# Patient Record
Sex: Female | Born: 1980 | Race: White | Hispanic: No | Marital: Married | Smoking: Never smoker
Health system: Southern US, Community
[De-identification: ages and names within clinical notes are randomized; demographics above are authoritative.]

## PROBLEM LIST (undated history)

## (undated) DIAGNOSIS — M419 Scoliosis, unspecified: Secondary | ICD-10-CM

## (undated) DIAGNOSIS — K567 Ileus, unspecified: Secondary | ICD-10-CM

## (undated) DIAGNOSIS — K529 Noninfective gastroenteritis and colitis, unspecified: Secondary | ICD-10-CM

## (undated) DIAGNOSIS — K9189 Other postprocedural complications and disorders of digestive system: Secondary | ICD-10-CM

## (undated) DIAGNOSIS — M1990 Unspecified osteoarthritis, unspecified site: Secondary | ICD-10-CM

## (undated) HISTORY — PX: OTHER SURGICAL HISTORY: SHX169

## (undated) HISTORY — PX: WISDOM TOOTH EXTRACTION: SHX21

---

## 2011-11-11 ENCOUNTER — Encounter (HOSPITAL_COMMUNITY): Payer: Self-pay | Admitting: Pharmacist

## 2011-11-14 ENCOUNTER — Encounter (HOSPITAL_COMMUNITY): Payer: Self-pay | Admitting: *Deleted

## 2011-11-14 ENCOUNTER — Inpatient Hospital Stay (HOSPITAL_COMMUNITY)
Admission: AD | Admit: 2011-11-14 | Discharge: 2011-11-21 | DRG: 370 | Disposition: A | Discharge status: Home / Self Care | Payer: BC Managed Care – PPO | Source: Ambulatory Visit | Attending: Obstetrics & Gynecology | Admitting: Obstetrics & Gynecology

## 2011-11-14 DIAGNOSIS — O99892 Other specified diseases and conditions complicating childbirth: Principal | ICD-10-CM | POA: Diagnosis present

## 2011-11-14 DIAGNOSIS — K560 Paralytic ileus: Secondary | ICD-10-CM | POA: Diagnosis not present

## 2011-11-14 DIAGNOSIS — K567 Ileus, unspecified: Secondary | ICD-10-CM | POA: Diagnosis not present

## 2011-11-14 DIAGNOSIS — Z22322 Carrier or suspected carrier of Methicillin resistant Staphylococcus aureus: Secondary | ICD-10-CM

## 2011-11-14 DIAGNOSIS — K5090 Crohn's disease, unspecified, without complications: Secondary | ICD-10-CM | POA: Diagnosis present

## 2011-11-14 HISTORY — DX: Noninfective gastroenteritis and colitis, unspecified: K52.9

## 2011-11-14 HISTORY — DX: Other postprocedural complications and disorders of digestive system: K91.89

## 2011-11-14 HISTORY — DX: Ileus, unspecified: K56.7

## 2011-11-15 ENCOUNTER — Encounter (HOSPITAL_COMMUNITY): Payer: Self-pay | Admitting: *Deleted

## 2011-11-15 ENCOUNTER — Encounter (HOSPITAL_COMMUNITY): Payer: Self-pay | Admitting: Anesthesiology

## 2011-11-15 ENCOUNTER — Encounter (HOSPITAL_COMMUNITY)
Admission: AD | Disposition: A | Discharge status: Home / Self Care | Payer: Self-pay | Source: Ambulatory Visit | Attending: Obstetrics & Gynecology

## 2011-11-15 ENCOUNTER — Inpatient Hospital Stay (HOSPITAL_COMMUNITY): Payer: BC Managed Care – PPO | Admitting: Anesthesiology

## 2011-11-15 LAB — CBC
Hemoglobin: 14.4 g/dL (ref 12.0–15.0)
MCH: 30.7 pg (ref 26.0–34.0)
MCHC: 34.4 g/dL (ref 30.0–36.0)
MCV: 89.1 fL (ref 78.0–100.0)
Platelets: 188 10*3/uL (ref 150–400)
RDW: 12.9 % (ref 11.5–15.5)
WBC: 19.9 10*3/uL — ABNORMAL HIGH (ref 4.0–10.5)

## 2011-11-15 LAB — ABO/RH: ABO/RH(D): O POS

## 2011-11-15 SURGERY — Surgical Case
Anesthesia: Spinal | Site: Abdomen | Wound class: Clean Contaminated

## 2011-11-15 MED ORDER — OXYCODONE-ACETAMINOPHEN 5-325 MG PO TABS
1.0000 | ORAL_TABLET | ORAL | Status: DC | PRN
  Administered 2011-11-15 – 2011-11-16 (×5): 1 via ORAL
  Filled 2011-11-15 (×2): qty 2
  Filled 2011-11-15 (×2): qty 1
  Filled 2011-11-15: qty 2

## 2011-11-15 MED ORDER — CEFAZOLIN SODIUM 1-5 GM-% IV SOLN: 1.0000 g | INTRAVENOUS | Status: DC

## 2011-11-15 MED ORDER — IBUPROFEN 600 MG PO TABS
600.0000 mg | ORAL_TABLET | Freq: Four times a day (QID) | ORAL | Status: DC
  Administered 2011-11-15 – 2011-11-17 (×7): 600 mg via ORAL
  Filled 2011-11-15 (×8): qty 1

## 2011-11-15 MED ORDER — METHYLERGONOVINE MALEATE 0.2 MG/ML IJ SOLN: 0.2000 mg | INTRAMUSCULAR | Status: DC | PRN

## 2011-11-15 MED ORDER — NALOXONE HCL 0.4 MG/ML IJ SOLN: 0.4000 mg | INTRAMUSCULAR | Status: DC | PRN

## 2011-11-15 MED ORDER — CEFAZOLIN SODIUM 1-5 GM-% IV SOLN
INTRAVENOUS | Status: AC
  Filled 2011-11-15: qty 50

## 2011-11-15 MED ORDER — MORPHINE SULFATE (PF) 0.5 MG/ML IJ SOLN
INTRAMUSCULAR | Status: DC | PRN
  Administered 2011-11-15: .1 mg via INTRATHECAL

## 2011-11-15 MED ORDER — LACTATED RINGERS IV SOLN: INTRAVENOUS | Status: DC

## 2011-11-15 MED ORDER — SODIUM CHLORIDE 0.9 % IV SOLN
1.0000 ug/kg/h | INTRAVENOUS | Status: DC | PRN
  Filled 2011-11-15: qty 2.5

## 2011-11-15 MED ORDER — FENTANYL CITRATE 0.05 MG/ML IJ SOLN
INTRAMUSCULAR | Status: DC | PRN
  Administered 2011-11-15: 35 ug via INTRAVENOUS
  Administered 2011-11-15: 50 ug via INTRAVENOUS

## 2011-11-15 MED ORDER — DIPHENHYDRAMINE HCL 25 MG PO CAPS: 25.0000 mg | ORAL_CAPSULE | Freq: Four times a day (QID) | ORAL | Status: DC | PRN

## 2011-11-15 MED ORDER — WITCH HAZEL-GLYCERIN EX PADS: 1.0000 "application " | MEDICATED_PAD | CUTANEOUS | Status: DC | PRN

## 2011-11-15 MED ORDER — LACTATED RINGERS IV SOLN
INTRAVENOUS | Status: DC | PRN
  Administered 2011-11-15 (×3): via INTRAVENOUS

## 2011-11-15 MED ORDER — METHYLERGONOVINE MALEATE 0.2 MG PO TABS: 0.2000 mg | ORAL_TABLET | ORAL | Status: DC | PRN

## 2011-11-15 MED ORDER — MORPHINE SULFATE 0.5 MG/ML IJ SOLN
INTRAMUSCULAR | Status: AC
  Filled 2011-11-15: qty 10

## 2011-11-15 MED ORDER — SIMETHICONE 80 MG PO CHEW: 80.0000 mg | CHEWABLE_TABLET | ORAL | Status: DC | PRN

## 2011-11-15 MED ORDER — OXYTOCIN 20 UNITS IN LACTATED RINGERS INFUSION - SIMPLE
125.0000 mL/h | INTRAVENOUS | Status: AC
  Administered 2011-11-15: 125 mL/h via INTRAVENOUS
  Filled 2011-11-15: qty 1000

## 2011-11-15 MED ORDER — NALBUPHINE SYRINGE 5 MG/0.5 ML
5.0000 mg | INJECTION | INTRAMUSCULAR | Status: DC | PRN
  Filled 2011-11-15: qty 1

## 2011-11-15 MED ORDER — SENNOSIDES-DOCUSATE SODIUM 8.6-50 MG PO TABS
2.0000 | ORAL_TABLET | Freq: Every day | ORAL | Status: DC
  Administered 2011-11-16: 2 via ORAL

## 2011-11-15 MED ORDER — ONDANSETRON HCL 4 MG/2ML IJ SOLN: 4.0000 mg | Freq: Three times a day (TID) | INTRAMUSCULAR | Status: DC | PRN

## 2011-11-15 MED ORDER — ONDANSETRON HCL 4 MG/2ML IJ SOLN
INTRAMUSCULAR | Status: DC | PRN
  Administered 2011-11-15: 4 mg via INTRAVENOUS

## 2011-11-15 MED ORDER — LANOLIN HYDROUS EX OINT: 1.0000 "application " | TOPICAL_OINTMENT | CUTANEOUS | Status: DC | PRN

## 2011-11-15 MED ORDER — MENTHOL 3 MG MT LOZG: 1.0000 | LOZENGE | OROMUCOSAL | Status: DC | PRN

## 2011-11-15 MED ORDER — ZOLPIDEM TARTRATE 5 MG PO TABS: 5.0000 mg | ORAL_TABLET | Freq: Every evening | ORAL | Status: DC | PRN

## 2011-11-15 MED ORDER — FAMOTIDINE IN NACL 20-0.9 MG/50ML-% IV SOLN
INTRAVENOUS | Status: AC
  Filled 2011-11-15: qty 50

## 2011-11-15 MED ORDER — SCOPOLAMINE 1 MG/3DAYS TD PT72
1.0000 | MEDICATED_PATCH | Freq: Once | TRANSDERMAL | Status: AC
  Administered 2011-11-15: 1.5 mg via TRANSDERMAL

## 2011-11-15 MED ORDER — ONDANSETRON HCL 4 MG/2ML IJ SOLN: 4.0000 mg | INTRAMUSCULAR | Status: DC | PRN

## 2011-11-15 MED ORDER — DIPHENHYDRAMINE HCL 50 MG/ML IJ SOLN: 25.0000 mg | INTRAMUSCULAR | Status: DC | PRN

## 2011-11-15 MED ORDER — OXYTOCIN 10 UNIT/ML IJ SOLN
INTRAMUSCULAR | Status: AC
  Filled 2011-11-15: qty 2

## 2011-11-15 MED ORDER — NALBUPHINE SYRINGE 5 MG/0.5 ML
5.0000 mg | INJECTION | INTRAMUSCULAR | Status: DC | PRN
Start: 2011-11-15 — End: 2011-11-17
  Filled 2011-11-15: qty 1

## 2011-11-15 MED ORDER — SODIUM CHLORIDE 0.9 % IJ SOLN: 3.0000 mL | INTRAMUSCULAR | Status: DC | PRN

## 2011-11-15 MED ORDER — SIMETHICONE 80 MG PO CHEW
80.0000 mg | CHEWABLE_TABLET | Freq: Three times a day (TID) | ORAL | Status: DC
  Administered 2011-11-15 – 2011-11-16 (×6): 80 mg via ORAL

## 2011-11-15 MED ORDER — ONDANSETRON HCL 4 MG/2ML IJ SOLN
INTRAMUSCULAR | Status: AC
  Filled 2011-11-15: qty 2

## 2011-11-15 MED ORDER — HYDROMORPHONE HCL PF 1 MG/ML IJ SOLN: 0.2500 mg | INTRAMUSCULAR | Status: DC | PRN

## 2011-11-15 MED ORDER — BUPIVACAINE IN DEXTROSE 0.75-8.25 % IT SOLN
INTRATHECAL | Status: DC | PRN
  Administered 2011-11-15: 1.5 mL via INTRATHECAL

## 2011-11-15 MED ORDER — PHENYLEPHRINE HCL 10 MG/ML IJ SOLN
INTRAMUSCULAR | Status: DC | PRN
  Administered 2011-11-15: 40 ug via INTRAVENOUS
  Administered 2011-11-15: 80 ug via INTRAVENOUS
  Administered 2011-11-15: 40 ug via INTRAVENOUS

## 2011-11-15 MED ORDER — IBUPROFEN 600 MG PO TABS: 600.0000 mg | ORAL_TABLET | Freq: Four times a day (QID) | ORAL | Status: DC | PRN

## 2011-11-15 MED ORDER — PHENYLEPHRINE 40 MCG/ML (10ML) SYRINGE FOR IV PUSH (FOR BLOOD PRESSURE SUPPORT)
PREFILLED_SYRINGE | INTRAVENOUS | Status: AC
  Filled 2011-11-15: qty 10

## 2011-11-15 MED ORDER — DIPHENHYDRAMINE HCL 25 MG PO CAPS: 25.0000 mg | ORAL_CAPSULE | ORAL | Status: DC | PRN

## 2011-11-15 MED ORDER — BUPIVACAINE HCL (PF) 0.25 % IJ SOLN
INTRAMUSCULAR | Status: AC
  Filled 2011-11-15: qty 30

## 2011-11-15 MED ORDER — CITRIC ACID-SODIUM CITRATE 334-500 MG/5ML PO SOLN
ORAL | Status: AC
  Administered 2011-11-15: 30 mL
  Filled 2011-11-15: qty 15

## 2011-11-15 MED ORDER — CEFAZOLIN SODIUM 1-5 GM-% IV SOLN
INTRAVENOUS | Status: DC | PRN
  Administered 2011-11-15: 1 g via INTRAVENOUS

## 2011-11-15 MED ORDER — TETANUS-DIPHTH-ACELL PERTUSSIS 5-2.5-18.5 LF-MCG/0.5 IM SUSP: 0.5000 mL | Freq: Once | INTRAMUSCULAR | Status: DC

## 2011-11-15 MED ORDER — PRENATAL MULTIVITAMIN CH
1.0000 | ORAL_TABLET | Freq: Every day | ORAL | Status: DC
  Administered 2011-11-16: 1 via ORAL
  Filled 2011-11-15: qty 1

## 2011-11-15 MED ORDER — ONDANSETRON HCL 4 MG PO TABS
4.0000 mg | ORAL_TABLET | ORAL | Status: DC | PRN
  Administered 2011-11-16: 4 mg via ORAL
  Filled 2011-11-15: qty 1

## 2011-11-15 MED ORDER — FENTANYL CITRATE 0.05 MG/ML IJ SOLN
INTRAMUSCULAR | Status: AC
  Filled 2011-11-15: qty 2

## 2011-11-15 MED ORDER — DIBUCAINE 1 % RE OINT: 1.0000 "application " | TOPICAL_OINTMENT | RECTAL | Status: DC | PRN

## 2011-11-15 MED ORDER — BUPIVACAINE HCL (PF) 0.25 % IJ SOLN
INTRAMUSCULAR | Status: DC | PRN
  Administered 2011-11-15: 10 mL

## 2011-11-15 MED ORDER — SCOPOLAMINE 1 MG/3DAYS TD PT72
MEDICATED_PATCH | TRANSDERMAL | Status: AC
  Administered 2011-11-15: 1.5 mg via TRANSDERMAL
  Filled 2011-11-15: qty 1

## 2011-11-15 MED ORDER — FENTANYL CITRATE 0.05 MG/ML IJ SOLN
INTRAMUSCULAR | Status: DC | PRN
  Administered 2011-11-15: 15 ug via INTRATHECAL

## 2011-11-15 MED ORDER — LACTATED RINGERS IV SOLN
INTRAVENOUS | Status: DC
  Administered 2011-11-15: 13:00:00 via INTRAVENOUS

## 2011-11-15 MED ORDER — METOCLOPRAMIDE HCL 5 MG/ML IJ SOLN: 10.0000 mg | Freq: Three times a day (TID) | INTRAMUSCULAR | Status: DC | PRN

## 2011-11-15 MED ORDER — DIPHENHYDRAMINE HCL 50 MG/ML IJ SOLN: 12.5000 mg | INTRAMUSCULAR | Status: DC | PRN

## 2011-11-15 MED ORDER — MEPERIDINE HCL 25 MG/ML IJ SOLN: 6.2500 mg | INTRAMUSCULAR | Status: DC | PRN

## 2011-11-15 MED ORDER — MORPHINE SULFATE (PF) 0.5 MG/ML IJ SOLN
INTRAMUSCULAR | Status: DC | PRN
  Administered 2011-11-15: 1 mg via INTRAVENOUS
  Administered 2011-11-15: 1.9 mg via INTRAVENOUS
  Administered 2011-11-15 (×2): 1 mg via INTRAVENOUS

## 2011-11-15 MED ORDER — HYDROMORPHONE HCL PF 1 MG/ML IJ SOLN
0.5000 mg | INTRAMUSCULAR | Status: AC
  Administered 2011-11-15: 0.5 mg via INTRAVENOUS
  Filled 2011-11-15: qty 1

## 2011-11-15 SURGICAL SUPPLY — 28 items
CLOTH BEACON ORANGE TIMEOUT ST (SAFETY) ×2 IMPLANT
CONTAINER PREFILL 10% NBF 15ML (MISCELLANEOUS) IMPLANT
DRESSING TELFA 8X3 (GAUZE/BANDAGES/DRESSINGS) IMPLANT
ELECT REM PT RETURN 9FT ADLT (ELECTROSURGICAL) ×2
ELECTRODE REM PT RTRN 9FT ADLT (ELECTROSURGICAL) ×1 IMPLANT
EXTRACTOR VACUUM M CUP 4 TUBE (SUCTIONS) IMPLANT
GAUZE SPONGE 4X4 12PLY STRL LF (GAUZE/BANDAGES/DRESSINGS) ×2 IMPLANT
GLOVE BIO SURGEON STRL SZ7.5 (GLOVE) ×4 IMPLANT
GOWN PREVENTION PLUS LG XLONG (DISPOSABLE) ×4 IMPLANT
GOWN PREVENTION PLUS XLARGE (GOWN DISPOSABLE) ×2 IMPLANT
KIT ABG SYR 3ML LUER SLIP (SYRINGE) IMPLANT
NEEDLE HYPO 25X1 1.5 SAFETY (NEEDLE) ×2 IMPLANT
NEEDLE HYPO 25X5/8 SAFETYGLIDE (NEEDLE) IMPLANT
NS IRRIG 1000ML POUR BTL (IV SOLUTION) ×2 IMPLANT
PACK C SECTION WH (CUSTOM PROCEDURE TRAY) ×2 IMPLANT
PAD ABD 7.5X8 STRL (GAUZE/BANDAGES/DRESSINGS) IMPLANT
SLEEVE SCD COMPRESS KNEE MED (MISCELLANEOUS) ×2 IMPLANT
STAPLER VISISTAT 35W (STAPLE) ×2 IMPLANT
SUT MNCRL 0 VIOLET CTX 36 (SUTURE) ×2 IMPLANT
SUT MON AB 2-0 CT1 27 (SUTURE) ×2 IMPLANT
SUT MON AB-0 CT1 36 (SUTURE) ×4 IMPLANT
SUT MONOCRYL 0 CTX 36 (SUTURE) ×2
SUT PLAIN 0 NONE (SUTURE) IMPLANT
SUT PLAIN 2 0 XLH (SUTURE) IMPLANT
SYR CONTROL 10ML LL (SYRINGE) ×2 IMPLANT
TOWEL OR 17X24 6PK STRL BLUE (TOWEL DISPOSABLE) ×4 IMPLANT
TRAY FOLEY CATH 14FR (SET/KITS/TRAYS/PACK) ×2 IMPLANT
WATER STERILE IRR 1000ML POUR (IV SOLUTION) ×2 IMPLANT

## 2011-11-15 NOTE — Progress Notes (Signed)
Patient ID: Connie Hunter, female   DOB: [birth year], 31 y.o.   MRN: [id number]  POSTOPERATIVE DAY # 0 S/P cesarean section   S:         Reports feeling ok - tired             Tolerating po intake / no  nausea / no  vomiting / no flatus / no  BM             Bleeding is light             Pain controlled with long-acting narcotic             Up ad lib / ambulatory  Newborn breast-feeding     O:  A & O x 3 NAD             VS:             Blood pressure 112/63, pulse 67, temperature 98.2 F (36.8 C), temperature source Oral, resp. rate 18, height 5\' 3"  (1.6 m), weight 60.782 kg (134 lb), SpO2 99.00%.  LABS: WBC/Hgb/Hct/Plts:  19.9/12.0/35.5/188 (04/22 0615)   Lungs: Clear and unlabored  Heart: regular rate and rhythm / no mumurs  Abdomen: soft, non-tender, non-distended             Fundus: firm, non-tender, Ueven             Dressing intact with some dried drainage on dressing (marked without expansion)              Perineum: intact  Lochia: light  Extremities: no edema, no calf pain or tenderness,SCD in place  A:        POD # 0 S/P cesarean section             P:        Routine postoperative care              Advance activity as tolerated     [name] 11/15/2011, 8:58 AM

## 2011-11-15 NOTE — MAU Provider Note (Signed)
  History   Labor  CSN: [id number]  Arrival date and time: 11/14/11 2314   None     No chief complaint on file.  HPI  OB History    Grav Para Term Preterm Abortions TAB SAB Ect Mult Living   1         0      Past Medical History  Diagnosis Date  . Colitis     Past Surgical History  Procedure Date  . Scoliosis   . Colon removed   . Wisdom tooth extraction     History reviewed. No pertinent family history.  History  Substance Use Topics  . Smoking status: Not on file  . Smokeless tobacco: Not on file  . Alcohol Use: Not on file    Allergies:  Allergies  Allergen Reactions  . Penicillins Rash    Prescriptions prior to admission  Medication Sig Dispense Refill  . acetaminophen (TYLENOL) 325 MG tablet Take 650 mg by mouth every 6 (six) hours as needed. For pain or headache      . adalimumab (HUMIRA) 40 MG/0.8ML injection Inject 40 mg into the skin every 14 (fourteen) days.      . cholecalciferol (VITAMIN D) 1000 UNITS tablet Take 2,000 Units by mouth daily.      . Prenatal Vit-Fe Fumarate-FA (PRENATAL MULTIVITAMIN) TABS Take 1 tablet by mouth daily.        ROS Physical Exam dictated   Blood pressure 122/76, pulse 69, temperature 97.9 F (36.6 C), temperature source Oral, resp. rate 18, height 5\' 3"  (1.6 m), weight 60.782 kg (134 lb), SpO2 100.00%.  Physical Exam  MAU Course  Procedures  MDM na  Assessment and Plan  37 weeks Active labor Active Anorectal disease Primary csection Note dictated  Connie Hunter 11/15/2011, 12:12 AM

## 2011-11-15 NOTE — Anesthesia Postprocedure Evaluation (Signed)
  Anesthesia Post-op Note  Patient: Connie Hunter  Procedure(s) Performed: Procedure(s) (LRB): CESAREAN SECTION (N/A)  Patient Location: Mother/Baby  Anesthesia Type: Spinal  Level of Consciousness: awake  Airway and Oxygen Therapy: Patient Spontanous Breathing  Post-op Pain: mild  Post-op Assessment: Patient's Cardiovascular Status Stable and Respiratory Function Stable  Post-op Vital Signs: stable  Complications: No apparent anesthesia complications

## 2011-11-15 NOTE — Addendum Note (Signed)
Addendum  created 11/15/11 0757 by Renford Dills, CRNA   Modules edited:Notes Section

## 2011-11-15 NOTE — Transfer of Care (Signed)
Immediate Anesthesia Transfer of Care Note  Patient: Connie Hunter  Procedure(s) Performed: Procedure(s) (LRB): CESAREAN SECTION (N/A)  Patient Location: PACU  Anesthesia Type: Spinal and Epidural  Level of Consciousness: awake, alert  and oriented  Airway & Oxygen Therapy: Patient Spontanous Breathing  Post-op Assessment: Report given to PACU RN and Post -op Vital signs reviewed and stable  Post vital signs: stable  Complications: No apparent anesthesia complications

## 2011-11-15 NOTE — H&P (Signed)
NAMEKEYNA, BLIZARD NO.:  [id number]  MEDICAL RECORD NO.:  [id number]  LOCATION:  AV40                          FACILITY:  [facility]  PHYSICIAN:  Lenoard Aden, M.D.DATE OF BIRTH:  Oct 24, 1980  DATE OF ADMISSION:  11/14/2011 DATE OF DISCHARGE:                             HISTORY & PHYSICAL   CHIEF COMPLAINT:  Labor.  HISTORY OF PRESENT ILLNESS:  She is a 31 year old white female, G1, P0, at [redacted] weeks gestation who presents in active labor.  PAST MEDICAL HISTORY:  She has a medical history of Crohn disease and colitis with prominent anorectal disease.  ALLERGIES:  PENICILLIN.  MEDICATIONS:  Prenatal vitamins.  Previously on Humira.  SOCIAL HISTORY:  Otherwise, noncontributory.  FAMILY HISTORY:  Seizure disorder, throat cancer, Down syndrome, and emphysema.  SURGICAL HISTORY:  Remarkable for scoliosis with rod placement and colon resection with J pouch in 2007.  PHYSICAL EXAMINATION:  GENERAL:  She is a well-developed, well- nourished, white female, in no acute distress. HEENT:  Normal. NECK:  Supple.  Full range of motion. LUNGS:  Clear. HEART:  Regular rate and rhythm. ABDOMEN:  Soft, gravid, nontender.  Cervix per RN is 5, 80% vertex, 0 station. EXTREMITIES:  There are no cords. NEUROLOGIC:  Nonfocal. SKIN:  Intact.  NST is reactive.  IMPRESSION: 1. Intrauterine pregnancy at 37 weeks in active labor. 2. History of ulcerative colitis, status post colon and rectum with a     history of prominent anorectal disease and a recommendation for     primary cesarean section.  PLAN:  Proceed with primary low segment transverse cesarean section. Risks of anesthesia, infection, bleeding, injury to abdominal organs, need for repair was discussed.  Delayed versus immediate complications include bowel and bladder injury noted.  The patient acknowledges.  We will proceed.     Lenoard Aden, M.D.     RJT/MEDQ  D:  11/14/2011  T:  11/15/2011  Job:   [id number]

## 2011-11-15 NOTE — Op Note (Signed)
Cesarean Section Procedure Note  Indications: History of Crohns colitis s/p resection with severe Anorectal disease Active labor  Pre-operative Diagnosis: 37 week 0 day pregnancy.  Post-operative Diagnosis: same  Surgeon: Lenoard Aden   Assistants: Juliene Pina, MD  Anesthesia: Local anesthesia 0.25.% bupivacaine and Spinal anesthesia  ASA Class: 2  Procedure Details  The patient was seen in the Holding Room. The risks, benefits, complications, treatment options, and expected outcomes were discussed with the patient.  The patient concurred with the proposed plan, giving informed consent. The risks of anesthesia, infection, bleeding and possible injury to other organs discussed. Injury to bowel, bladder, or ureter with possible need for repair discussed. Possible need for transfusion with secondary risks of hepatitis or HIV acquisition discussed. Post operative complications to include but not limited to DVT, PE and Pneumonia noted. The site of surgery properly noted/marked. The patient was taken to Operating Room # 1, identified as Connie Hunter and the procedure verified as C-Section Delivery. A Time Out was held and the above information confirmed.  After induction of anesthesia, the patient was draped and prepped in the usual sterile manner. A Pfannenstiel incision was made and carried down through the subcutaneous tissue to the fascia. Fascial incision was made and extended transversely using Mayo scissors. The fascia was separated from the underlying rectus tissue superiorly and inferiorly. The peritoneum was identified and entered. Peritoneal incision was extended longitudinally. Significant omental adhesions to anterior abdominal wall and bladder flap are lysed sharply. Piece of omentum is excised.  The utero-vesical peritoneal reflection was incised transversely and the bladder flap was bluntly freed from the lower uterine segment. A low transverse uterine incision(Kerr hysterotomy) was  made. Delivered from vertex presentation was a  female with Apgar scores of 8 at one minute and 9 at five minutes. Bulb suctioning gently performed. Neonatal team in attendance.After the umbilical cord was clamped and cut cord blood was obtained for evaluation. The placenta was removed intact and appeared normal. The uterus was curetted with a dry lap pack. Good hemostasis was noted.The uterine outline, tubes and ovaries appeared normal. The uterine incision was closed with running locked sutures of 0 Monocryl x 2 layers. Hemostasis was observed. Lavage was carried out until clear.The fascia was then reapproximated with running sutures of 0 Monocryl. The skin was reapproximated with staples.  Instrument, sponge, and needle counts were correct prior the abdominal closure and at the conclusion of the case.   Findings: Omental adhesions  Estimated Blood Loss:  500         Drains: foley                 Specimens: placenta to path with Succenturiate lobe                 Complications:  None; patient tolerated the procedure well.         Disposition: stable         Condition: stable  Attending Attestation: I performed the procedure.

## 2011-11-15 NOTE — OR Nursing (Signed)
Fundal Massage by DLWegner RN 

## 2011-11-15 NOTE — Anesthesia Procedure Notes (Signed)
Combined Spinal-Epidural  Patient location during procedure: OR Start time: 11/15/2011 12:40 AM Staffing Anesthesiologist: CASSIDY, AMY Performed by: anesthesiologist  Preanesthetic Checklist Completed: patient identified, site marked, surgical consent, pre-op evaluation, timeout performed, IV checked, risks and benefits discussed and monitors and equipment checked Spinal Block Patient position: sitting Prep: site prepped and draped and DuraPrep Patient monitoring: cardiac monitor, continuous pulse ox, blood pressure and heart rate Approach: midline Location: L4-5 Injection technique: catheter Needle Needle type: Tuohy and Sprotte  Needle gauge: 24 G Needle length: 12.7 cm Needle insertion depth: 5 cm Catheter type: closed end flexible Catheter size: 19 g Catheter at skin depth: 10 cm Additional Notes Difficulty at L3-4 (has had rod placed for scoliosis).  Second attempt was at L4-5 and was immediately successful.  Of note, epidural space felt scarred when spinal needle was passed through tuohy, but clear free flow CSF was immediately present from spinal needle.  Epidural catheter was threaded with some difficulty initially, but easily after initial exit from [place].  SAB rising appropriately, patient no longer feeling contractions.  Jasmine December, MD

## 2011-11-15 NOTE — Anesthesia Postprocedure Evaluation (Signed)
Anesthesia Post Note  Patient: Connie Hunter  Procedure(s) Performed: Procedure(s) (LRB): CESAREAN SECTION (N/A)  Anesthesia type: Spinal  Patient location: PACU  Post pain: Pain level controlled  Post assessment: Post-op Vital signs reviewed  Last Vitals:  Filed Vitals:   11/15/11 0356  BP: 114/66  Pulse: 69  Temp: 36.7 C  Resp: 18    Post vital signs: Reviewed  Level of consciousness: awake  Complications: No apparent anesthesia complications

## 2011-11-15 NOTE — Anesthesia Preprocedure Evaluation (Signed)
Anesthesia Evaluation  Patient identified by MRN, date of birth, ID band Patient awake    Reviewed: Allergy & Precautions, H&P , NPO status , Patient's Chart, lab work & pertinent test results, reviewed documented beta blocker date and time   History of Anesthesia Complications Negative for: history of anesthetic complications  Airway Mallampati: III TM Distance: >3 FB Neck ROM: full    Dental  (+) Teeth Intact   Pulmonary neg pulmonary ROS,  breath sounds clear to auscultation        Cardiovascular negative cardio ROS  Rhythm:regular Rate:Normal     Neuro/Psych  Neuromuscular disease (scoliosis s/p thoracic rod placement) negative psych ROS   GI/Hepatic Neg liver ROS, Ulcerative colitis, s/p near total colectomy with reanastamosis   Endo/Other  negative endocrine ROS  Renal/GU negative Renal ROS     Musculoskeletal   Abdominal   Peds  Hematology negative hematology ROS (+)   Anesthesia Other Findings   Reproductive/Obstetrics (+) Pregnancy (36 weeks, SROM, labor)                           Anesthesia Physical Anesthesia Plan  ASA: III and Emergent  Anesthesia Plan: Combined Spinal and Epidural   Post-op Pain Management:    Induction:   Airway Management Planned:   Additional Equipment:   Intra-op Plan:   Post-operative Plan:   Informed Consent: I have reviewed the patients History and Physical, chart, labs and discussed the procedure including the risks, benefits and alternatives for the proposed anesthesia with the patient or authorized representative who has indicated his/her understanding and acceptance.   Dental Advisory Given  Plan Discussed with: Surgeon and CRNA  Anesthesia Plan Comments:         Anesthesia Quick Evaluation

## 2011-11-16 ENCOUNTER — Encounter (HOSPITAL_COMMUNITY): Payer: Self-pay | Admitting: Obstetrics and Gynecology

## 2011-11-16 MED ORDER — PROMETHAZINE HCL 25 MG PO TABS: 25.0000 mg | ORAL_TABLET | Freq: Four times a day (QID) | ORAL | Status: DC | PRN

## 2011-11-16 MED ORDER — PROMETHAZINE HCL 25 MG RE SUPP
25.0000 mg | Freq: Four times a day (QID) | RECTAL | Status: DC | PRN
  Administered 2011-11-17 (×2): 25 mg via RECTAL
  Filled 2011-11-16 (×2): qty 1

## 2011-11-16 MED ORDER — BISACODYL 10 MG RE SUPP
10.0000 mg | Freq: Once | RECTAL | Status: AC
  Administered 2011-11-16: 10 mg via RECTAL
  Filled 2011-11-16: qty 1

## 2011-11-16 NOTE — Progress Notes (Signed)
Patient ID: Connie Hunter, female   DOB: November 07, 1980, 31 y.o.   MRN: [id number]  POSTOPERATIVE DAY # 1 S/P cesarean section   S:         Reports feeling well, better than yesterday.             Tolerating po intake / no  nausea / no  vomiting / no flatus / no  BM             Bleeding is light             Pain controlled with Motrin and Percocet.             Up ad lib / ambulatory  Newborn breast-feeding    Circ planned for today  O:  A & O x 3 NAD             VS:             Blood pressure 92/62, pulse 80, temperature 98.2 F (36.8 C), temperature source Oral, resp. rate 20, height 5\' 3"  (1.6 m), weight 60.782 kg (134 lb), SpO2 97.00%.  LABS:    Lab Results  Component Value Date   WBC 19.9* 11/15/2011   HGB 12.0 11/15/2011   HCT 35.5* 11/15/2011   MCV 89.0 11/15/2011   PLT 188 11/15/2011     Lungs: Clear and unlabored  Heart: regular rate and rhythm / no mumurs  Abdomen: soft, non-tender, non-distended             Fundus: firm, non-tender, Ueven             Dressing intact with some dried drainage on dressing (marked without expansion)              Perineum: intact  Lochia: light  Extremities: no edema, no calf pain or tenderness,SCD in place  A:        POD # 1 S/P cesarean section  Hx colon resection with J-pouch / ulcerative colitis              P:        Routine postoperative care              Advance activity as tolerated  Encouraged ambulation and warm PO fluids to increase gut motility  Anticipate discharge home in AM.      Rashi Giuliani 11/16/2011, 7:59 AM

## 2011-11-17 ENCOUNTER — Inpatient Hospital Stay (HOSPITAL_COMMUNITY): Payer: BC Managed Care – PPO

## 2011-11-17 ENCOUNTER — Encounter (HOSPITAL_COMMUNITY): Payer: Self-pay | Admitting: Radiology

## 2011-11-17 DIAGNOSIS — R141 Gas pain: Secondary | ICD-10-CM

## 2011-11-17 DIAGNOSIS — R143 Flatulence: Secondary | ICD-10-CM

## 2011-11-17 DIAGNOSIS — R142 Eructation: Secondary | ICD-10-CM

## 2011-11-17 DIAGNOSIS — R112 Nausea with vomiting, unspecified: Secondary | ICD-10-CM

## 2011-11-17 LAB — SAMPLE TO BLOOD BANK

## 2011-11-17 LAB — CBC
HCT: 39.9 % (ref 36.0–46.0)
Hemoglobin: 13.5 g/dL (ref 12.0–15.0)
MCH: 30.8 pg (ref 26.0–34.0)
MCHC: 33.8 g/dL (ref 30.0–36.0)
MCV: 91.1 fL (ref 78.0–100.0)
Platelets: 293 10*3/uL (ref 150–400)
RBC: 4.38 MIL/uL (ref 3.87–5.11)
RDW: 13.3 % (ref 11.5–15.5)
WBC: 7.8 10*3/uL (ref 4.0–10.5)

## 2011-11-17 LAB — COMPREHENSIVE METABOLIC PANEL
ALT: 9 U/L (ref 0–35)
AST: 14 U/L (ref 0–37)
Albumin: 1.8 g/dL — ABNORMAL LOW (ref 3.5–5.2)
Alkaline Phosphatase: 99 U/L (ref 39–117)
BUN: 9 mg/dL (ref 6–23)
CO2: 26 mEq/L (ref 19–32)
Calcium: 8 mg/dL — ABNORMAL LOW (ref 8.4–10.5)
Chloride: 100 mEq/L (ref 96–112)
Creatinine, Ser: 0.71 mg/dL (ref 0.50–1.10)
GFR calc Af Amer: >90 mL/min (ref >90)
GFR calc non Af Amer: >90 mL/min (ref >90)
Glucose, Bld: 201 mg/dL — ABNORMAL HIGH (ref 70–99)
Potassium: 3.8 mEq/L (ref 3.5–5.1)
Sodium: 138 mEq/L (ref 135–145)
Total Bilirubin: 0.4 mg/dL (ref 0.3–1.2)
Total Protein: 6 g/dL (ref 6.0–8.3)

## 2011-11-17 LAB — MRSA PCR SCREENING: MRSA by PCR: POSITIVE — AB

## 2011-11-17 MED ORDER — NALOXONE HCL 0.4 MG/ML IJ SOLN: 0.4000 mg | INTRAMUSCULAR | Status: DC | PRN

## 2011-11-17 MED ORDER — MUPIROCIN 2 % EX OINT
1.0000 "application " | TOPICAL_OINTMENT | Freq: Two times a day (BID) | CUTANEOUS | Status: DC
  Administered 2011-11-17 – 2011-11-20 (×8): 1 via NASAL
  Filled 2011-11-17: qty 22

## 2011-11-17 MED ORDER — CHLORHEXIDINE GLUCONATE CLOTH 2 % EX PADS
6.0000 | MEDICATED_PAD | Freq: Every day | CUTANEOUS | Status: DC
  Administered 2011-11-18 – 2011-11-21 (×4): 6 via TOPICAL

## 2011-11-17 MED ORDER — ONDANSETRON HCL 4 MG/2ML IJ SOLN: 4.0000 mg | Freq: Four times a day (QID) | INTRAMUSCULAR | Status: DC | PRN

## 2011-11-17 MED ORDER — HYDROMORPHONE 0.3 MG/ML IV SOLN
INTRAVENOUS | Status: DC
  Administered 2011-11-17: 14:00:00 via INTRAVENOUS
  Administered 2011-11-17: 0.799 mL via INTRAVENOUS
  Administered 2011-11-17: 0.99 mg via INTRAVENOUS
  Administered 2011-11-18: 0.2 mg via INTRAVENOUS
  Administered 2011-11-18: 0.4 mg via INTRAVENOUS
  Administered 2011-11-18: 1.39 mg via INTRAVENOUS
  Administered 2011-11-18: 0.999 mg via INTRAVENOUS
  Administered 2011-11-18: 0.39 mg via INTRAVENOUS
  Administered 2011-11-19: 0.399 mg via INTRAVENOUS
  Administered 2011-11-19: 0.8 mg via INTRAVENOUS
  Filled 2011-11-17: qty 25

## 2011-11-17 MED ORDER — PROMETHAZINE HCL 25 MG/ML IJ SOLN
12.5000 mg | INTRAMUSCULAR | Status: DC | PRN
  Administered 2011-11-17: 12.5 mg via INTRAVENOUS
  Filled 2011-11-17: qty 1

## 2011-11-17 MED ORDER — BUTORPHANOL TARTRATE 2 MG/ML IJ SOLN
1.0000 mg | INTRAMUSCULAR | Status: DC | PRN
  Administered 2011-11-17: 1 mg via INTRAVENOUS
  Filled 2011-11-17: qty 1

## 2011-11-17 MED ORDER — SODIUM CHLORIDE 0.9 % IJ SOLN: 9.0000 mL | INTRAMUSCULAR | Status: DC | PRN

## 2011-11-17 MED ORDER — DEXTROSE IN LACTATED RINGERS 5 % IV SOLN
INTRAVENOUS | Status: DC
  Administered 2011-11-17: 11:00:00 via INTRAVENOUS

## 2011-11-17 MED ORDER — DIPHENHYDRAMINE HCL 50 MG/ML IJ SOLN: 12.5000 mg | Freq: Four times a day (QID) | INTRAMUSCULAR | Status: DC | PRN

## 2011-11-17 MED ORDER — IOHEXOL 300 MG/ML  SOLN
100.0000 mL | Freq: Once | INTRAMUSCULAR | Status: AC | PRN
  Administered 2011-11-17: 100 mL via INTRAVENOUS

## 2011-11-17 MED ORDER — DIPHENHYDRAMINE HCL 12.5 MG/5ML PO ELIX
12.5000 mg | ORAL_SOLUTION | Freq: Four times a day (QID) | ORAL | Status: DC | PRN
  Filled 2011-11-17: qty 5

## 2011-11-17 NOTE — Progress Notes (Addendum)
POSTOPERATIVE DAY # 3 S/P cesarean section   S:        Reports feeling poorly - abdominal pain and vomiting for past several hours             Unable to tolerate any po intake / + nausea / + vomiting green bile              No flatus / small BM - scant amount of liquid after ducolax suppository last pm             Bleeding is light             Up ad lib / ambulatory   Newborn breast-feeding  / Circumcision done   O:  A & O x 3 / general malaise / lethargic             VS: Blood pressure 125/79, pulse 67, temperature 99 F (37.2 C), temperature source Oral, resp. rate 20, height 5\' 3"  (1.6 m), weight 60.782 kg (134 lb), SpO2 97.00%.  LABS:   (pending)  Lungs: Clear and unlabored  Heart: regular rate and rhythm / no mumurs  Abdomen: absent bowel sounds - no activity                               firm / tense / + tender / + distention             Fundus: firm, non-tender             Dressing OFF              Incision:  approximated with staples                             no erythema / no ecchymosis / no drainage  Perineum: no edema  Lochia: light  Extremities: no edema, no calf pain or tenderness, negative Homans              HX: Crohns dz / bowel resection with J-pouch 2007                      followed at [facility] by Dr Karel Jarvis  A:        POD # 3 S/P cesarean section            Likely bowel obstruction  P:        Routine postoperative care              Consult with Dr Billy Coast - recommend [facility] on-call consult              1)  NPO             2)  IV started - maintain 151ml/hr             3)  Phenergan IV prn vomiting             4)  Two-view abdominal x-ray - flat plate and upright             5)  TC placed for inpatient surgical consult this am             6)  CMP / hold clot     Connie Hunter 11/17/2011, 9:15 AM

## 2011-11-17 NOTE — Consult Note (Signed)
Have reviewed the films and discussed with my PA.  The CT shows no sign of obstruction, just severe ileus.  Would continue NG drainage, IV hydration, minimize narcotics, ambulate.  We will follow along, but no surgical indications.  Connie Hunter. Corliss Skains, MD, [facility]  11/17/2011 6:06 PM

## 2011-11-17 NOTE — Progress Notes (Signed)
2 Days Post-Op Procedure(s) (LRB): CESAREAN SECTION (N/A) Breast feeding. H/O Crohn disease with Colectomy and J pouch creation at [facility].  Subjective: Not tolerating diet, pos nausea / vomiting.  No gas.  Severe generalized abdominal pain. Voiding.  Objective: BP 125/79  Pulse 67  Temp(Src) 99 F (37.2 C) (Oral)  Resp 20  Ht 5\' 3"  (1.6 m)  Wt 60.782 kg (134 lb)  BMI 23.74 kg/m2  SpO2 97%  Breastfeeding? Unknown Lungs: clear Heart: normal rate and rhythm Abdomen:  Severe distention.  Tender through out.  No BS heard.  No high pitch heard. Extremities: Homans sign is negative, no sign of DVT Incision: healing well  Abdominal XRay:  Diffuse small bowel distention with A/F levels.  Adynamic Ileus vs Small bowel obstruction.  A little free air in abdo c/w POD #2 C/S.  Assessment: s/p Procedure(s): CESAREAN SECTION: ileus present vs small bowel obstruction.  Plan: NPO, will place a NG tube.  Transfer to AICU.  General surgery contacted, Dr Corliss Skains informed of the XRay results and asked in consultation ASAP (Dr Corliss Skains was in OR when called). PCA ordered.  CBC/CMP/hold blood.  LOS: 3 days    Roshaunda Starkey,MARIE-LYNE 11/17/2011, 10:41 AM

## 2011-11-17 NOTE — Consult Note (Signed)
Reason for Consult: Abdominal distension, SBO vs Ileus Referring Physician: Laniah Grimm is an 31 y.o. female.  HPI: 31 y/o female who had a C-Section 11/15/11. She was doing well the first postoperative morning 11/16/11. During the day her abdomen became distended she started having abdominal discomfort followed by nausea and vomiting last night. She had about 800 mL of emesis during the evening. She was given something for constipation which did not relieve her symptoms her abdomen remained distended. This morning she is extremely distended, tender with ongoing nausea. She was seen by the attending and two-view abdomen was obtained. This showed diffuse gaseous small bowel distention with air-fluid levels compatible with an ileus versus small bowel obstruction. Patient was noted to have a previous colectomy and J-pouch creation. She's been transferred to the ICU, an NG has been placed, currently with very little drainage. We are asked to see the patient in consultation. CBC obtained this a.m. she is normal white count is stable hemoglobin and hematocrit. Prior to admission she was just on prenatal vitamins and Humira.  Past Medical History  Diagnosis Date  . Crohn's disease with history of colon resection with J pouch 2007 followed at [facility], by DR. Pleury. Infected rectum, with non-healing rectal fissures       Past Surgical History  Procedure Date  . Scoliosis with Surgery 1997   . Colon resection J pouch 2007 [facility]   . Wisdom tooth extraction   . Cesarean section 11/15/2011    Procedure: CESAREAN SECTION;  Surgeon: Lenoard Aden, MD;  Location: [facility];  Service: Gynecology;  Laterality: N/A;  primary cesarean section of baby boy at 0058   APGAR 9/9    History reviewed. No pertinent family history. Both parents in good health siblings all fine except one sister with Down's Syndrome  Social History:  reports that she has never smoked. She has never used smokeless tobacco. Her  alcohol and drug histories not on file.  Allergies:  Allergies  Allergen Reactions  . Penicillins Rash    Medications:  Prior to Admission:  Prescriptions prior to admission  Medication Sig Dispense Refill  . acetaminophen (TYLENOL) 325 MG tablet Take 650 mg by mouth every 6 (six) hours as needed. For pain or headache      . adalimumab (HUMIRA) 40 MG/0.8ML injection Inject 40 mg into the skin every 14 (fourteen) days.      . cholecalciferol (VITAMIN D) 1000 UNITS tablet Take 2,000 Units by mouth daily.      . Prenatal Vit-Fe Fumarate-FA (PRENATAL MULTIVITAMIN) TABS Take 1 tablet by mouth daily.       Scheduled:   . bisacodyl  10 mg Rectal Once  . scopolamine  1 patch Transdermal Once  . TDaP  0.5 mL Intramuscular Once  . DISCONTD: ibuprofen  600 mg Oral Q6H  . DISCONTD: prenatal multivitamin  1 tablet Oral Daily  . DISCONTD: senna-docusate  2 tablet Oral QHS  . DISCONTD: simethicone  80 mg Oral TID PC & HS   Continuous:   . dextrose 5% lactated ringers 100 mL/hr at 11/17/11 1049  . DISCONTD: lactated ringers 125 mL/hr at 11/15/11 1307  . DISCONTD: naloxone (NARCAN) infusion     ZOX:[id number], dibucaine, diphenhydrAMINE, lanolin, promethazine, witch hazel-glycerin, DISCONTD: diphenhydrAMINE, DISCONTD: diphenhydrAMINE, DISCONTD: diphenhydrAMINE, DISCONTD: menthol-cetylpyridinium, DISCONTD: methylergonovine, DISCONTD: methylergonovine, DISCONTD: metoCLOPramide (REGLAN) injection, DISCONTD: nalbuphine, DISCONTD: nalbuphine, DISCONTD: naloxone (NARCAN) infusion, DISCONTD: naloxone, DISCONTD: ondansetron (ZOFRAN) IV DISCONTD: ondansetron (ZOFRAN) IV, DISCONTD: ondansetron, DISCONTD: oxyCODONE-acetaminophen, DISCONTD: promethazine, DISCONTD:  promethazine, DISCONTD: simethicone, DISCONTD: sodium chloride, DISCONTD: zolpidem Anti-infectives     Start     Dose/Rate Route Frequency Ordered Stop   11/15/11 0600   ceFAZolin (ANCEF) IVPB 1 g/50 mL premix  Status:  Discontinued        1  g 100 mL/hr over 30 Minutes Intravenous On call to O.R. 11/15/11 0017 11/15/11 0023   11/15/11 0017   ceFAZolin (ANCEF) 1-5 GM-% IVPB     Comments: BRAY, ERIN: cabinet override         11/15/11 0017 11/15/11 1229          Results for orders placed during the hospital encounter of 11/14/11 (from the past 48 hour(s))  SAMPLE TO BLOOD BANK     Status: Normal   Collection Time   11/17/11  9:40 AM      Component Value Range Comment   Blood Bank Specimen SAMPLE AVAILABLE FOR TESTING      Sample Expiration 11/20/2011     COMPREHENSIVE METABOLIC PANEL     Status: Abnormal   Collection Time   11/17/11  9:40 AM      Component Value Range Comment   Sodium 138  135 - 145 (mEq/L)    Potassium 3.8  3.5 - 5.1 (mEq/L)    Chloride 100  96 - 112 (mEq/L)    CO2 26  19 - 32 (mEq/L)    Glucose, Bld 201 (*) 70 - 99 (mg/dL)    BUN 9  6 - 23 (mg/dL)    Creatinine, Ser 6.29  0.50 - 1.10 (mg/dL)    Calcium 8.0 (*) 8.4 - 10.5 (mg/dL)    Total Protein 6.0  6.0 - 8.3 (g/dL)    Albumin 1.8 (*) 3.5 - 5.2 (g/dL)    AST 14  0 - 37 (U/L)    ALT 9  0 - 35 (U/L)    Alkaline Phosphatase 99  39 - 117 (U/L)    Total Bilirubin 0.4  0.3 - 1.2 (mg/dL)    GFR calc non Af Amer >90  >90 (mL/min)    GFR calc Af Amer >90  >90 (mL/min)   CBC     Status: Normal   Collection Time   11/17/11 10:42 AM      Component Value Range Comment   WBC 7.8  4.0 - 10.5 (K/uL)    RBC 4.38  3.87 - 5.11 (MIL/uL)    Hemoglobin 13.5  12.0 - 15.0 (g/dL)    HCT 52.8  41.3 - 24.4 (%)    MCV 91.1  78.0 - 100.0 (fL)    MCH 30.8  26.0 - 34.0 (pg)    MCHC 33.8  30.0 - 36.0 (g/dL)    RDW 01.0  27.2 - 53.6 (%)    Platelets 293  150 - 400 (K/uL)     Dg Abd 2 Views  11/17/2011  *RADIOLOGY REPORT*  Clinical Data: 3 days status post C-section.  Worsening nausea vomiting.  ABDOMEN - 2 VIEW  Comparison: None.  Findings: Upright film shows a small amount of intraperitoneal free air under the dome of the right hemidiaphragm.  Intraperitoneal free air  is not unexpected on postop day #3 after cesarean section. There is diffuse gaseous small bowel distention with multiple stairs-step air-fluid levels.  Long suture line in the midline pelvis is compatible with the patient's history of colonic resection and J pouch creation.  Lumbar fusion rod noted on the left.  Surgical clips over the lower pelvis are consistent with  lower transverse incision.  IMPRESSION: Diffuse gaseous small bowel distention with associated air fluid levels.  Imaging features could be compatible with an adynamic ileus versus small bowel obstruction.  The patient is noted to be status post colectomy with J pouch creation.  There is a small amount of intraperitoneal free air, but this is not unexpected on postop day #3 from cesarean section.  Original Report Authenticated By: ERIC A. MANSELL, M.D.    Review of Systems  Constitutional: Negative.   HENT: Negative.   Eyes: Negative.   Respiratory: Negative.   Cardiovascular: Negative.   Gastrointestinal: Positive for heartburn, nausea, vomiting and abdominal pain. Negative for diarrhea, constipation, blood in stool and melena.       Heartburn prior to delivery, N,V, And abdominal pain started last PM. She has chronically  loose  Soft stools,   Genitourinary: Negative.   Musculoskeletal: Negative.  Negative for myalgias.  Skin: Negative.   Neurological: Negative.   Endo/Heme/Allergies: Negative.   Psychiatric/Behavioral: Negative.    Blood pressure 128/74, pulse 80, temperature 97.9 F (36.6 C), temperature source Oral, resp. rate 16, height 5\' 3"  (1.6 m), weight 58.65 kg (129 lb 4.8 oz), SpO2 100.00%, unknown if currently breastfeeding. Physical Exam  Constitutional: She is oriented to person, place, and time. She appears well-developed.       Ill appearing 30 Y/O with ongoing abdominal distension  HENT:  Head: Normocephalic and atraumatic.  Nose: Nose normal.  Eyes: Conjunctivae and EOM are normal. Pupils are equal, round,  and reactive to light. No scleral icterus.  Neck: Normal range of motion. Neck supple. No JVD present. No tracheal deviation present. No thyromegaly present.  Cardiovascular: Normal rate, regular rhythm, normal heart sounds and intact distal pulses.  Exam reveals no gallop.   No murmur heard. Respiratory: Effort normal and breath sounds normal. No respiratory distress. She has no wheezes. She has no rales. She exhibits no tenderness.  GI: She exhibits distension (Very distended and tender.). There is tenderness. There is rebound and guarding.       C-section incision is normal appearing  Musculoskeletal: She exhibits edema. She exhibits no tenderness.  Lymphadenopathy:    She has no cervical adenopathy.  Neurological: She is alert and oriented to person, place, and time. No cranial nerve deficit. Coordination normal.  Skin: Skin is warm and dry. No rash noted. There is pallor.  Psychiatric: She has a normal mood and affect. Her behavior is normal. Judgment and thought content normal.    Assessment/Plan: 1. Abdominal distention, nausea and vomiting. 2. History of Crohn's disease with colectomy and J-pouch. 3. Status post C-section 11/15/11. 4. History of scoliosis with surgery 1999. 5. 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 On Humira.  Plan: Agree with bowel rest, NG drainage, hydration. We will obtain a CT scan and also confirm placement of the NG tube. We will follow with you. [facility] physician assistant for Dr. Manus Rudd.  Gean Larose 11/17/2011, 1:19 PM

## 2011-11-17 NOTE — Progress Notes (Signed)
Pt having gas pains and unable to pass gas since beginning of shift. Complaint of nausea, and numerous bouts of vomiting. Given prune juice, zofran po, and told to ambulate. MD on call notified and given orders for dulcolax suppository. Pt had bm a couple hours after that, but still vomiting, about 800 cc total during shift. Called MD for order for phenergan suppository. Pt feeling better after this, will continue to monitor and encourage fluids.   Delman Cheadle, RN

## 2011-11-18 ENCOUNTER — Inpatient Hospital Stay (HOSPITAL_COMMUNITY): Payer: BC Managed Care – PPO

## 2011-11-18 ENCOUNTER — Encounter (HOSPITAL_COMMUNITY): Payer: Self-pay

## 2011-11-18 DIAGNOSIS — K9189 Other postprocedural complications and disorders of digestive system: Secondary | ICD-10-CM

## 2011-11-18 DIAGNOSIS — K567 Ileus, unspecified: Secondary | ICD-10-CM

## 2011-11-18 HISTORY — DX: Other postprocedural complications and disorders of digestive system: K91.89

## 2011-11-18 HISTORY — DX: Other postprocedural complications and disorders of digestive system: K56.7

## 2011-11-18 LAB — COMPREHENSIVE METABOLIC PANEL
ALT: 8 U/L (ref 0–35)
AST: 14 U/L (ref 0–37)
Alkaline Phosphatase: 83 U/L (ref 39–117)
CO2: 30 mEq/L (ref 19–32)
Calcium: 7.8 mg/dL — ABNORMAL LOW (ref 8.4–10.5)
Chloride: 104 mEq/L (ref 96–112)
GFR calc non Af Amer: >90 mL/min (ref >90)
Potassium: 3.5 mEq/L (ref 3.5–5.1)
Sodium: 141 mEq/L (ref 135–145)
Total Bilirubin: 0.4 mg/dL (ref 0.3–1.2)

## 2011-11-18 LAB — CBC
Platelets: 301 10*3/uL (ref 150–400)
RBC: 4.1 MIL/uL (ref 3.87–5.11)
WBC: 3.7 10*3/uL — ABNORMAL LOW (ref 4.0–10.5)

## 2011-11-18 MED ORDER — KCL IN DEXTROSE-NACL 40-5-0.9 MEQ/L-%-% IV SOLN
INTRAVENOUS | Status: DC
  Filled 2011-11-18 (×2): qty 1000

## 2011-11-18 MED ORDER — POTASSIUM CHLORIDE 2 MEQ/ML IV SOLN
INTRAVENOUS | Status: DC
  Administered 2011-11-18 – 2011-11-20 (×5): via INTRAVENOUS
  Filled 2011-11-18 (×8): qty 1000

## 2011-11-18 MED ORDER — POTASSIUM CHLORIDE 10 MEQ/100ML IV SOLN
10.0000 meq | INTRAVENOUS | Status: AC
Start: 2011-11-18 — End: 2011-11-18
  Administered 2011-11-18 (×4): 10 meq via INTRAVENOUS
  Filled 2011-11-18 (×4): qty 100

## 2011-11-18 NOTE — Progress Notes (Signed)
3 Days Post-Op  Subjective: Still very distended, no nausea since contrast, +BM x2 since contrast.  Objective: Vital signs in last 24 hours: Temp:  [97.9 F (36.6 C)-98.4 F (36.9 C)] 98.4 F (36.9 C) (04/25 0415) Pulse Rate:  [70-96] 90  (04/25 0600) Resp:  [16-20] 20  (04/25 0552) BP: (116-147)/(66-81) 116/73 mmHg (04/25 0415) SpO2:  [96 %-100 %] 98 % (04/25 0600) Weight:  [58.65 kg (129 lb 4.8 oz)] 58.65 kg (129 lb 4.8 oz) (04/24 1100) Last BM Date: 11/17/11 Afebrile, Vss, CBC OK   Intake/Output from previous day: 04/24 0701 - 04/25 0700 In: 3118.3 [I.V.:2118.3; NG/GT:1000] Out: 2001 [Emesis/NG output:2000; Stool:1] Intake/Output this shift: Total I/O In: 1400 [I.V.:1400] Out: 501 [Emesis/NG output:500; Stool:1]  General appearance: alert, cooperative and no distress GI: Abd still very distended, a bit tender, rare BS, . Up getting cleaned up  Lab Results:   Basename 11/18/11 0545 11/17/11 1042  WBC 3.7* 7.8  HGB 12.2 13.5  HCT 37.6 39.9  PLT 301 293    BMET  Basename 11/17/11 0940  NA 138  K 3.8  CL 100  CO2 26  GLUCOSE 201*  BUN 9  CREATININE 0.71  CALCIUM 8.0*   PT/INR No results found for this basename: LABPROT:2,INR:2 in the last 72 hours   Lab 11/17/11 0940  AST 14  ALT 9  ALKPHOS 99  BILITOT 0.4  PROT 6.0  ALBUMIN 1.8*     Lipase  No results found for this basename: lipase     Studies/Results: Ct Abdomen Pelvis W Contrast  11/17/2011  *RADIOLOGY REPORT*  Clinical Data: Abdominal pain and distention.  Nausea vomiting. Postpartum.  Crohn's disease.  Previous colectomy with J pouch creation.  CT ABDOMEN AND PELVIS WITH CONTRAST  Technique:  Multidetector CT imaging of the abdomen and pelvis was performed following the standard protocol during bolus administration of intravenous contrast.  Contrast: OMNIPAQUE IOHEXOL 300 MG/ML  SOLN  Comparison: None.  Findings: There is diffuse dilatation of small bowel seen to the level of the  rectal pouch, with air-fluid levels throughout.  This is consistent with an ileus.  There is no evidence of a transition point or bowel wall thickening.  Recent postsurgical changes are seen from cesarean section with minimal residual pneumoperitoneum.  Enlarged postpartum uterus again seen.  Mild ascites is seen, however there is no evidence of abscess.  No focal inflammatory process identified.  Gallbladder sludge is noted, however there is no evidence of acute cholecystitis.  The liver, spleen, pancreas, and kidneys are normal in appearance.  No evidence of hydronephrosis.  No soft tissue masses are identified. Lumbar spine fusion hardware noted.  IMPRESSION:  1.  Severe ileus pattern.  No evidence of bowel obstruction. Postop changes from previous colectomy and J pouch. 2.  Mild ascites.  No evidence of focal inflammatory process or abscess. 3.  Postpartum uterus and expected postoperative changes from recent C-section.  Original Report Authenticated By: Danae Orleans, M.D.   Dg Abd 2 Views  11/17/2011  *RADIOLOGY REPORT*  Clinical Data: 3 days status post C-section.  Worsening nausea vomiting.  ABDOMEN - 2 VIEW  Comparison: None.  Findings: Upright film shows a small amount of intraperitoneal free air under the dome of the right hemidiaphragm.  Intraperitoneal free air is not unexpected on postop day #3 after cesarean section. There is diffuse gaseous small bowel distention with multiple stairs-step air-fluid levels.  Long suture line in the midline pelvis is compatible with the patient's  history of colonic resection and J pouch creation.  Lumbar fusion rod noted on the left.  Surgical clips over the lower pelvis are consistent with lower transverse incision.  IMPRESSION: Diffuse gaseous small bowel distention with associated air fluid levels.  Imaging features could be compatible with an adynamic ileus versus small bowel obstruction.  The patient is noted to be status post colectomy with J pouch creation.   There is a small amount of intraperitoneal free air, but this is not unexpected on postop day #3 from cesarean section.  Original Report Authenticated By: ERIC A. MANSELL, M.D.   Dg Abd Portable 1v  11/17/2011  *RADIOLOGY REPORT*  Clinical Data: Evaluate nasogastric tube placement.  PORTABLE ABDOMEN - 1 VIEW  Comparison: 11/17/2011  Findings: There is a nasogastric tube in the left upper quadrant of the abdomen.  Tube is likely within the stomach body.  Again noted are dilated loops of small bowel in the abdomen.  Stable appearance of the spinal hardware involving the thoracolumbar spine.  IMPRESSION: Nasogastric tube in the stomach.  Persistent gaseous distention of the small bowel.  The findings are compatible with obstruction versus small bowel ileus.  Original Report Authenticated By: Richarda Overlie, M.D.    Medications:    . Chlorhexidine Gluconate Cloth  6 each Topical Q0600  . HYDROmorphone PCA 0.3 mg/mL   Intravenous Q4H  . mupirocin ointment  1 application Nasal BID  . scopolamine  1 patch Transdermal Once  . TDaP  0.5 mL Intramuscular Once  . DISCONTD: ibuprofen  600 mg Oral Q6H  . DISCONTD: prenatal multivitamin  1 tablet Oral Daily  . DISCONTD: senna-docusate  2 tablet Oral QHS  . DISCONTD: simethicone  80 mg Oral TID PC & HS    Assessment/Plan 1. Abdominal distention, nausea and vomiting.  2. History of Crohn's disease with colectomy and J-pouch.  3. Status post C-section 11/15/11.  4. History of scoliosis with surgery 1999.  5. 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 On Humira.   Plan :  CT as noted shows distension, but no obstruction.  I think she has a bad ileus after her C-section.  I would continue NG suction, 2 liter output yesterday recorded, most of it before the NG. Continue IV fluids, ambulate allot, limit narcotics.  Film pending I would try to get K+ to 4.0 or higher. I will change her IV and add some IV Kcl   LOS: 4 days     Owais Pruett 11/18/2011

## 2011-11-18 NOTE — Progress Notes (Signed)
POSTOPERATIVE DAY # 4 S/P cesarean section   S:        Reports feeling somewhat better - abdominal pain improved on PCA, no more N/V             Unable to tolerate any po intake / + nausea / + vomiting green bile              BM x 3 this am and flatus with BM's             Bleeding is light             Ambulated short distance this AM   Newborn breast and bottle, patient only pumping today  / Circumcision done   O:  A & O x 3 / general malaise / lethargic             VS: Blood pressure 121/68, pulse 98, temperature 97.8 F (36.6 C), temperature source Oral, resp. rate 18, height 5\' 3"  (1.6 m), weight 58.65 kg (129 lb 4.8 oz), SpO2 99.00%, unknown if currently breastfeeding.  LABS: WBC/Hgb/Hct/Plts:  3.7/12.2/37.6/301 (04/25 0545) (pending)  Lungs: Clear and unlabored  Heart: regular rate and rhythm / no mumurs  Abdomen: decreased bowel sounds                               + distention             Fundus: firm, non-tender             Dressing OFF              Incision:  approximated with staples                             no erythema / no ecchymosis / no drainage  Perineum: no edema  Lochia: light  Extremities: no edema, no calf pain or tenderness, negative Homans              HX: Crohns dz / bowel resection with J-pouch 2007                      follow[facility] at [facility] by Dr Karel Jarvis  A:        POD # 4 S/P cesarean section            Likely bowel obstruction  P:        Routine postoperative care              GI plan of care per Dr. Corliss Skains             Continue bowel rest and NG tube  Encourage ambulation today  PCA pump for pain control  Continue pumping during awake times  DC staples and replace with steristrips.  Dr. Billy Coast updated, will reassess status later this PM and consider if stable to transfer to post partum floor.     Connie Hunter 11/18/2011, 11:11 AM

## 2011-11-18 NOTE — Progress Notes (Addendum)
Multiple loose bowel movements today.  Less distended.  Post-operative ileus resolved. No small bowel obstruction  Will d/c NG tube - start clears Diet per primary team Will sign off No need for follow-up  Wilmon Arms. Corliss Skains, MD, [facility]  11/18/2011 6:33 PM

## 2011-11-18 NOTE — Progress Notes (Signed)
UR chart review completed.  

## 2011-11-19 LAB — BASIC METABOLIC PANEL
BUN: 5 mg/dL — ABNORMAL LOW (ref 6–23)
Creatinine, Ser: 0.49 mg/dL — ABNORMAL LOW (ref 0.50–1.10)
GFR calc non Af Amer: >90 mL/min (ref >90)
Glucose, Bld: 90 mg/dL (ref 70–99)
Potassium: 3.8 mEq/L (ref 3.5–5.1)

## 2011-11-19 MED ORDER — OXYCODONE-ACETAMINOPHEN 5-325 MG PO TABS
1.0000 | ORAL_TABLET | ORAL | Status: DC | PRN
  Administered 2011-11-20: 1 via ORAL
  Filled 2011-11-19: qty 1

## 2011-11-19 NOTE — Progress Notes (Signed)
Family is doing well and pt is grateful to be feeling better.  She has good family support and she is ready to be home adjusting to being a mother.  They were appreciative of the visit.   [id number] [place] Pager, [phone] 2:45   11/19/11 1500  Clinical Encounter Type  Visited With Patient and family together  Visit Type Initial

## 2011-11-19 NOTE — Progress Notes (Signed)
Pt requesting Full Liquid Diet.  Bowel sounds faint, except in LLQ where they are hypoactive.  Percussions reveals significant tympanic sounds.  Abdomen distended & soft, but less distended than earlier in afternoon.  Phoned Dr. Juliene Pina with above information.  Order received to advance diet to Full Liquid, but restrict choices to grits or yogurt and avoid milk at this time.

## 2011-11-19 NOTE — Progress Notes (Signed)
Patient ID: Connie Hunter, female   DOB: 19-Mar-1981, 31 y.o.   MRN: [id number] POD # 5  Subjective: Pt reports feeling better this am. No nausea, still reports feeling very distended and passing some gas, but limited/ Pain controlled with PCA Dilaudid Tolerating po now/Voiding without problems/ No n/v. Reports passing very loose stools Activity: out of bed and ambulate Bleeding is light Newborn info:  Information for the patient's newborn:  [name], [name] [[id number]]  female circ already done/ Feeding: breast and bottle   Objective: VS: Blood pressure 112/73, pulse 88, temperature 98.1 F (36.7 C), temperature source Oral, resp. rate 20, height 5\' 3"  (1.6 m), weight 58.65 kg (129 lb 4.8 oz), SpO2 98.00%, unknown if currently breastfeeding.    Physical Exam:  General: alert and cooperative, still anxious about passing more gas CV: Regular rate and rhythm, S1S2 present or without murmur or extra heart sounds Resp: clear Abdomen: distended and up near breasts, few bowel sounds heard Uterine Fundus: firm, below umbilicus, nontender Incision dry and intact, staples removed and steri strips on Perineum: is normal Lochia: minimal Ext: extremities normal, atraumatic, no cyanosis or edema and Homans sign is negative, no sign of DVT    A/P: POD # 5/ G2P0101/ S/P C/Section   Hx of Crohn's Disease, Bowel resection with J-pouch in 2007 Followed in [facility] by Dr. Karel Jarvis Hx Post op ileus D/C PCA Dilaudid and change to oral narcotics per Dr. Billy Coast Continue with clear diet Continue with ambulation Transfer to medsurg floor  SignedTOSHIE, DEMELO [facility] 11/19/2011, 9:41 AM

## 2011-11-20 NOTE — Progress Notes (Signed)
Patient ID: Connie Hunter, female   DOB: Feb 06, 1981, 31 y.o.   MRN: [id number] Subjective: POD# 6 Is hungry. Tolerated full liquids incl some yogurt and grits and wants to eat a bit more solid today. No nausea/ vomiting or abdominal pain. Some back pain. Flatus ++, bm yesterday. No diarrhea. No belching. NO fever/ chills. Feels much better., Not taking any pain meds since does not have surgical pain.   Objective: Vital signs in last 24 hours: Temp:  [98.1 F (36.7 C)-99 F (37.2 C)] 98.8 F (37.1 C) (04/27 1000) Pulse Rate:  [64-97] 64  (04/27 1000) Resp:  [18-20] 20  (04/27 1000) BP: (100-119)/(68-75) 104/68 mmHg (04/27 1000) SpO2:  [99 %-100 %] 99 % (04/27 1000) Weight change:  Last BM Date: 11/19/11  Physical Exam:  General: A&O x 3, no distress, ambulating well.   CV: Regular rate and rhythm, S1S2 nl  Resp: clear, no crackles/ wheezing Abdomen: distended but soft, normal active bowel sounds.  Incision CDI, uterus fundus 3 cm below umbilicus, firm   Ext: extremities normal,  Homans sign is negative, no sign of DVT   A/P:  POD # 6 s/p c-section, post op ileus, recovering.  Hx of Crohn's Disease, Bowel resection with J-pouch in 2007, Followed in [facility] by Dr. Karel Jarvis  Much improved, advance to small volume easy to digest solids, options reviewed.  Continue with ambulation. Anticipate home tomorrow if continues to show progress.  MRSA- contact precautions.   Baby BOY, doing well, is discharged, but in room with mom.   Bernd Crom R 11/20/2011, 12:55 PM

## 2011-11-20 NOTE — Progress Notes (Signed)
Patient ID: Connie Hunter, female   DOB: October 11, 1980, 31 y.o.   MRN: [id number]  POSTOPERATIVE DAY # 5  Dr Juliene Pina to see patient today.    Marlinda Mike 11/20/2011, 10:52 AM

## 2011-11-21 MED ORDER — OXYCODONE-ACETAMINOPHEN 5-325 MG PO TABS: 1.0000 | ORAL_TABLET | Freq: Three times a day (TID) | ORAL | Status: AC | PRN

## 2011-11-21 MED ORDER — IBUPROFEN 600 MG PO TABS: 600.0000 mg | ORAL_TABLET | Freq: Three times a day (TID) | ORAL | Status: AC | PRN

## 2011-11-21 NOTE — Discharge Summary (Signed)
POSTOPERATIVE DISCHARGE SUMMARY:  Patient ID: Connie Hunter MRN: [id number] DOB/AGE: 10/19/1980 30 y.o.  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 Colitis   . Postoperative ileus 11/18/2011    Past surgical history:  Past Surgical History  Procedure Date  . Scoliosis   . Colon removed   . Wisdom tooth extraction   . Cesarean section 11/15/2011    Procedure: CESAREAN SECTION;  Surgeon: Lenoard Aden, MD;  Location: [facility];  Service: Gynecology;  Laterality: N/A;  primary cesarean section of baby boy at 0058   APGAR 9/9    Family History: History reviewed. No pertinent family history.  Social History:  reports that she has never smoked. She has never used smokeless tobacco. Her alcohol and drug histories not on file.  Allergies: Penicillins   Current Medications at time of admission:  Prior to Admission medications   Medication Sig Start Date End Date Taking? Authorizing Provider  acetaminophen (TYLENOL) 325 MG tablet Take 650 mg by mouth every 6 (six) hours as needed. For pain or headache   Yes Historical Provider, MD  adalimumab (HUMIRA) 40 MG/0.8ML injection Inject 40 mg into the skin every 14 (fourteen) days.   Yes Historical Provider, MD    cholecalciferol (VITAMIN D) 1000 UNITS tablet Take 2,000 Units by mouth daily.   Yes Historical Provider, MD  Prenatal Vit-Fe Fumarate-FA (PRENATAL MULTIVITAMIN) TABS Take 1 tablet by mouth daily.   Yes Historical Provider, MD    Intrapartum Course: Admit in active labor - planned cesarean delivery due to significant anorectal disease. Proceed to emergent c/s due to late stage of labor.  Procedures: Cesarean section delivery of female newborn by Dr Billy Coast  See operative report for further details  Postoperative / postpartum course: development of postoperative ileus on day 2-3 after surgery / surgical consult done by Dr Corliss Skains / radiology confirmation of severe ileus / placement of GI tube to decompress bowel and initiated on bowel rest / dx MRSA carrier state upon admission to AICU - preventative treatment initiated with topical & nasal protocol / placed on contact precautions for remainder of hospitalization / GI tube out POD 5 with slow advancement of diet / + bowel motility with bowel activity and bowel movements by POD 5 / discharged on day 6 in stable condition  Physical Exam:   VSS: Blood pressure 115/74, pulse 97, temperature 99.6 F (37.6 C), temperature source Oral, resp. rate 18, height 5\' 3"  (1.6 m), weight 58.65 kg (129 lb 4.8 oz), SpO2 97.00%, unknown if currently breastfeeding.  LABS:   preop hgb 14.4 / postop hgb 12.2 + MRSA nasal screen  Alert and oriented / NAD Lungs - clear Abdomen - soft /  non-distended / non-tender / active bowel sounds Ext - no edema / no evidence of DVT   Incision:  Well-approximated /steri-strips in place /  No erythema / no ecchymosis / no drainage  Discharge Instructions:  Discharged Condition: stable Activity: postoperative restrictions x 2 weeks and pelvic rest x 6 weeks Diet: routine and instructions to advance slowly over next 7 days - bland/small meals Medications: see below Medication List  As of 11/21/2011  7:54 AM   TAKE these  medications         acetaminophen 325 MG tablet   Commonly known as: TYLENOL   Take 650 mg by mouth every 6 (six) hours as needed. For pain or headache      cholecalciferol 1000 UNITS tablet   Commonly known as: VITAMIN D   Take 2,000 Units by mouth daily.      HUMIRA 40 MG/0.8ML injection   Generic drug: adalimumab   Inject 40 mg into the skin every 14 (fourteen) days.      ibuprofen 600 MG tablet   Commonly known as: ADVIL,MOTRIN   Take 1 tablet (600 mg total) by mouth every 8 (eight) hours as needed for pain.      oxyCODONE-acetaminophen 5-325 MG per tablet   Commonly known as: PERCOCET   Take 1 tablet by mouth every 8 (eight) hours as needed.      prenatal multivitamin Tabs   Take 1 tablet by mouth daily.           Condition: stable Postpartum Instructions: refer to practice specific booklet Discharge to: home Disposition: Final discharge disposition not confirmed Follow up :  Follow-up Information    Follow up with Lenoard Aden, MD. Schedule an appointment as soon as possible for a visit in 1 week.   Contact information:   [address] [place] [address] [phone]           Signed: Marlinda Mike 11/21/2011, 7:54 AM

## 2011-11-21 NOTE — Progress Notes (Signed)
Pt d/c home, ambulatory with infant in car seat to private car. D/C instructions and prescriptions reviewed with pt. Pt verbalized understanding.

## 2011-11-21 NOTE — Progress Notes (Signed)
Patient ID: Connie Hunter, female   DOB: [birth year], 31 y.o.   MRN: [id number]  POSTOPERATIVE DAY # 6 S/P cesarean section with resolving severe illeus   S:         Reports feeling much better / slept some last night             Tolerating po intake / no nausea / no vomiting / + flatus / + BM             Bleeding is spotting             Pain controlled with occasional motrin and percocet             Up ad lib / ambulatory  Newborn breast feeding     O:  A & O x 3 NAD             VS: Blood pressure 115/74, pulse 97, temperature 99.6 F (37.6 C), temperature source Oral, resp. rate 18, height 5\' 3"  (1.6 m), weight 58.65 kg (129 lb 4.8 oz), SpO2 97.00%, unknown if currently breastfeeding.  Lungs: Clear and unlabored  Heart: regular rate and rhythm / no mumurs  Abdomen: soft, non-tender, non-distended, active BS             Fundus: firm, non-tender, U-2             Incision: staples out / steri-strips intact / no erythema / no ecchymosis / no drainage  Perineum: no edema  Lochia: scant  Extremities: no edema, no calf pain or tenderness  A:        POD # 6 S/P cesarean section             Resolving severe ileus - tolerating bland/ light diet            MRSA screen positive  P:        Routine postoperative care              Discharge home             Small meals / bland diet             OV with Taavon in 1 week     Marlinda Mike 11/21/2011, 7:36 AM

## 2011-11-24 ENCOUNTER — Other Ambulatory Visit (HOSPITAL_COMMUNITY): Payer: BC Managed Care – PPO

## 2011-11-30 ENCOUNTER — Encounter (HOSPITAL_COMMUNITY): Admission: AD | Payer: Self-pay | Source: Home / Self Care

## 2011-11-30 ENCOUNTER — Inpatient Hospital Stay (HOSPITAL_COMMUNITY): Admission: AD | Admit: 2011-11-30 | Payer: Self-pay | Source: Home / Self Care | Admitting: Obstetrics and Gynecology

## 2011-11-30 SURGERY — Surgical Case
Anesthesia: Spinal

## 2013-03-27 ENCOUNTER — Emergency Department (HOSPITAL_COMMUNITY)
Admission: EM | Admit: 2013-03-27 | Discharge: 2013-03-27 | Disposition: A | Discharge status: Home Health | Payer: BC Managed Care – PPO | Attending: Pediatric Emergency Medicine | Admitting: Pediatric Emergency Medicine

## 2013-03-27 ENCOUNTER — Encounter (HOSPITAL_COMMUNITY): Payer: Self-pay | Admitting: *Deleted

## 2013-03-27 DIAGNOSIS — Z792 Long term (current) use of antibiotics: Secondary | ICD-10-CM | POA: Insufficient documentation

## 2013-03-27 DIAGNOSIS — K5289 Other specified noninfective gastroenteritis and colitis: Secondary | ICD-10-CM | POA: Insufficient documentation

## 2013-03-27 DIAGNOSIS — Z880 Allergy status to penicillin: Secondary | ICD-10-CM | POA: Insufficient documentation

## 2013-03-27 DIAGNOSIS — Z9889 Other specified postprocedural states: Secondary | ICD-10-CM | POA: Insufficient documentation

## 2013-03-27 DIAGNOSIS — Z79899 Other long term (current) drug therapy: Secondary | ICD-10-CM | POA: Insufficient documentation

## 2013-03-27 DIAGNOSIS — Z203 Contact with and (suspected) exposure to rabies: Secondary | ICD-10-CM

## 2013-03-27 DIAGNOSIS — Z8739 Personal history of other diseases of the musculoskeletal system and connective tissue: Secondary | ICD-10-CM | POA: Insufficient documentation

## 2013-03-27 HISTORY — DX: Scoliosis, unspecified: M41.9

## 2013-03-27 MED ORDER — RABIES IMMUNE GLOBULIN 150 UNIT/ML IM INJ
20.0000 [IU]/kg | INJECTION | Freq: Once | INTRAMUSCULAR | Status: AC
  Administered 2013-03-27: 1050 [IU] via INTRAMUSCULAR
  Filled 2013-03-27: qty 8

## 2013-03-27 MED ORDER — RABIES VACCINE, PCEC IM SUSR
1.0000 mL | Freq: Once | INTRAMUSCULAR | Status: AC
  Administered 2013-03-27: 1 mL via INTRAMUSCULAR
  Filled 2013-03-27: qty 1

## 2013-03-27 NOTE — ED Provider Notes (Signed)
CSN: [id number]     Arrival date & time 03/27/13  1109 History   First MD Initiated Contact with Patient 03/27/13 1111     Chief Complaint  Patient presents with  . Rabies Injection   (Consider location/radiation/quality/duration/timing/severity/associated sxs/prior Treatment) HPI Connie Hunter is a previously healthy 31 y/o female who presents after bat exposure. She woke up this morning and found a bat in her bedroom. Husband captured the bat using a bed sheet and let it out of the house. There is no known direct contact with but uncertain if there was contact or bite when she was asleep. Connie Hunter has been acting like her usual self without any noticeable bite marks. Denies any fever or recent illness.  Past Medical History  Diagnosis Date  . Colitis   . Postoperative ileus 11/18/2011  . Scoliosis    Past Surgical History  Procedure Laterality Date  . Scoliosis    . Colon removed    . Wisdom tooth extraction    . Cesarean section  11/15/2011    Procedure: CESAREAN SECTION;  Surgeon: Lenoard Aden, MD;  Location: [facility];  Service: Gynecology;  Laterality: N/A;  primary cesarean section of baby boy at 0058   APGAR 9/9   History reviewed. No pertinent family history. History  Substance Use Topics  . Smoking status: Never Smoker   . Smokeless tobacco: Never Used     Comment: n/a  . Alcohol Use: Yes     Comment: occasional   OB History   Grav Para Term Preterm Abortions TAB SAB Ect Mult Living   2 1  1      1      Review of Systems  All other systems reviewed and are negative.    Allergies  Penicillins  Home Medications   Current Outpatient Rx  Name  Route  Sig  Dispense  Refill  . ciprofloxacin (CIPRO) 250 MG tablet   Oral   Take 250 mg by mouth 2 (two) times daily.         . fluconazole (DIFLUCAN) 150 MG tablet   Oral   Take 150 mg by mouth once a week.         . metroNIDAZOLE (FLAGYL) 500 MG tablet   Oral   Take 500 mg by mouth daily.         Bertram Gala Glycol-Propyl Glycol (SYSTANE OP)   Ophthalmic   Apply to eye.         . sulfaSALAzine (AZULFIDINE) 500 MG tablet   Oral   Take 1,500 mg by mouth 2 (two) times daily.         . Prenatal Vit-Fe Fumarate-FA (PRENATAL MULTIVITAMIN) TABS   Oral   Take 1 tablet by mouth daily.          BP 117/81  Pulse 85  Temp(Src) 98.3 F (36.8 C) (Oral)  Resp 18  Wt 114 lb 4 oz (51.823 kg)  BMI 20.24 kg/m2  SpO2 98% Physical Exam  Constitutional: She is oriented to person, place, and time. She appears well-developed and well-nourished. No distress.  HENT:  Head: Normocephalic and atraumatic.  Mouth/Throat: Oropharynx is clear and moist.  Eyes: Conjunctivae and EOM are normal. Pupils are equal, round, and reactive to light.  Neck: Normal range of motion. Neck supple.  Cardiovascular: Normal rate, regular rhythm, normal heart sounds and intact distal pulses.   No murmur heard. Pulmonary/Chest: Effort normal and breath sounds normal. No respiratory distress.  Abdominal: Soft. Bowel sounds are  normal. She exhibits no distension. There is no tenderness.  Lymphadenopathy:    She has no cervical adenopathy.  Neurological: She is alert and oriented to person, place, and time.  Skin: Skin is warm and dry. No rash noted.  Psychiatric: She has a normal mood and affect. Her behavior is normal.    ED Course  Procedures (including critical care time) Labs Review Labs Reviewed - No data to display Imaging Review No results found.  MDM  Connie Hunter is a previously healthy 32 y/o female who presents after  bat exposure. Bat was captured bat but let it go. Will treat with Rabies vaccine and Immunoglobulin. Return to ED for the remainder of your vaccine series (day 7, 21 and 28 post exposure).        Neldon Labella, MD 03/27/13 1401

## 2013-03-27 NOTE — ED Notes (Signed)
Per pt she found a bat in her bedroom. Pt is not sure how long the bat had been there. Pt states bat did not appear sick, just confused.

## 2013-03-30 ENCOUNTER — Emergency Department (HOSPITAL_COMMUNITY)
Admission: EM | Admit: 2013-03-30 | Discharge: 2013-03-30 | Disposition: A | Discharge status: Home / Self Care | Payer: BC Managed Care – PPO | Source: Home / Self Care

## 2013-03-30 ENCOUNTER — Encounter (HOSPITAL_COMMUNITY): Payer: Self-pay | Admitting: *Deleted

## 2013-03-30 DIAGNOSIS — Z203 Contact with and (suspected) exposure to rabies: Secondary | ICD-10-CM

## 2013-03-30 MED ORDER — RABIES VACCINE, PCEC IM SUSR
1.0000 mL | Freq: Once | INTRAMUSCULAR | Status: AC
  Administered 2013-03-30: 1 mL via INTRAMUSCULAR

## 2013-03-30 MED ORDER — RABIES VACCINE, PCEC IM SUSR
INTRAMUSCULAR | Status: AC
  Filled 2013-03-30: qty 1

## 2013-03-30 NOTE — ED Notes (Signed)
Presents for rabies injection. 

## 2013-04-03 ENCOUNTER — Encounter (HOSPITAL_COMMUNITY): Payer: Self-pay | Admitting: *Deleted

## 2013-04-03 ENCOUNTER — Emergency Department (INDEPENDENT_AMBULATORY_CARE_PROVIDER_SITE_OTHER)
Admission: EM | Admit: 2013-04-03 | Discharge: 2013-04-03 | Disposition: A | Discharge status: Home / Self Care | Payer: BC Managed Care – PPO | Source: Home / Self Care

## 2013-04-03 DIAGNOSIS — Z203 Contact with and (suspected) exposure to rabies: Secondary | ICD-10-CM

## 2013-04-03 MED ORDER — RABIES VACCINE, PCEC IM SUSR
INTRAMUSCULAR | Status: AC
  Filled 2013-04-03: qty 1

## 2013-04-03 MED ORDER — RABIES VACCINE, PCEC IM SUSR
1.0000 mL | Freq: Once | INTRAMUSCULAR | Status: AC
  Administered 2013-04-03: 1 mL via INTRAMUSCULAR

## 2013-04-03 NOTE — ED Notes (Signed)
Presents  For  Rabies  Vaccine

## 2013-04-10 ENCOUNTER — Encounter (HOSPITAL_COMMUNITY): Payer: Self-pay | Admitting: *Deleted

## 2013-04-10 ENCOUNTER — Emergency Department (HOSPITAL_COMMUNITY)
Admission: EM | Admit: 2013-04-10 | Discharge: 2013-04-10 | Disposition: A | Discharge status: Home / Self Care | Payer: BC Managed Care – PPO | Source: Home / Self Care

## 2013-04-10 DIAGNOSIS — Z203 Contact with and (suspected) exposure to rabies: Secondary | ICD-10-CM

## 2013-04-10 MED ORDER — RABIES VACCINE, PCEC IM SUSR
INTRAMUSCULAR | Status: AC
  Filled 2013-04-10: qty 1

## 2013-04-10 MED ORDER — RABIES VACCINE, PCEC IM SUSR
1.0000 mL | Freq: Once | INTRAMUSCULAR | Status: AC
  Administered 2013-04-10: 1 mL via INTRAMUSCULAR

## 2013-04-10 NOTE — ED Notes (Signed)
Here for final rabies injection. No issues with previous injections.

## 2013-04-17 NOTE — ED Provider Notes (Signed)
I have seen and evaluated the patient.  The patient is well appearing without signs of respiratory distress or dehydration.  I supervised the resident's care of the patient and I have reviewed and agree with the resident's note except where it differs from my documentation.  Discharged to home after discussion with caregiver about signs and symptoms of concern for which they should return.   Caregiver comfortable with this plan.  Sharene Skeans MD.    Ermalinda Memos, MD 04/17/13 1321

## 2013-07-09 ENCOUNTER — Other Ambulatory Visit: Payer: Self-pay | Admitting: Dermatology

## 2013-11-12 IMAGING — CT CT ABD-PELV W/ CM
1 of 3 series · 14 of 32 positions shown, 19 images · IV contrast (OMNIPAQUE)
Comparison: None.

CLINICAL DATA: Abdominal pain and distention.  Nausea vomiting.
Postpartum.  Crohn's disease.  Previous colectomy with J pouch
creation.

CT ABDOMEN AND PELVIS WITH CONTRAST
TECHNIQUE: Multidetector CT imaging of the abdomen and pelvis was
performed following the standard protocol during bolus
administration of intravenous contrast.
Contrast: 100mL OMNIPAQUE IOHEXOL 300 MG/ML  SOLN

[Series 2: routine abdomen/pelvis with · axial · 0.83mm/px · z∈[-428,-44]mm · 14 of 89 slices shown, 19 images]
[im 6/89  soft-tissue]
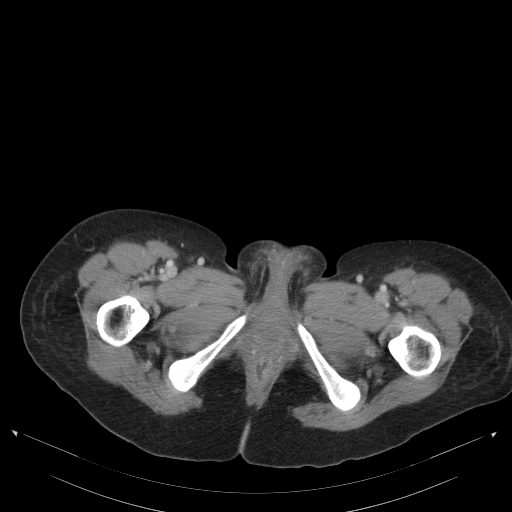
[im 6/89  bone]
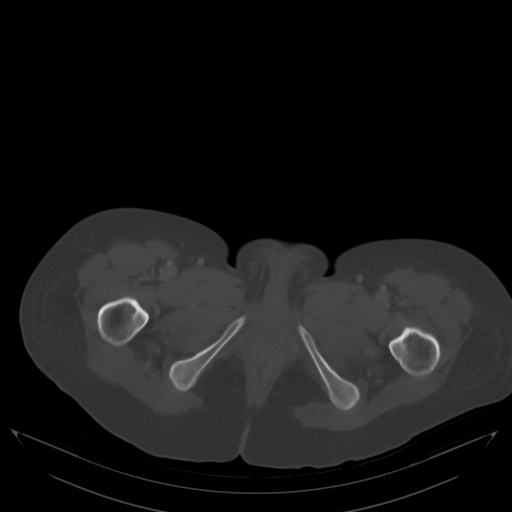
[im 12/89  soft-tissue]
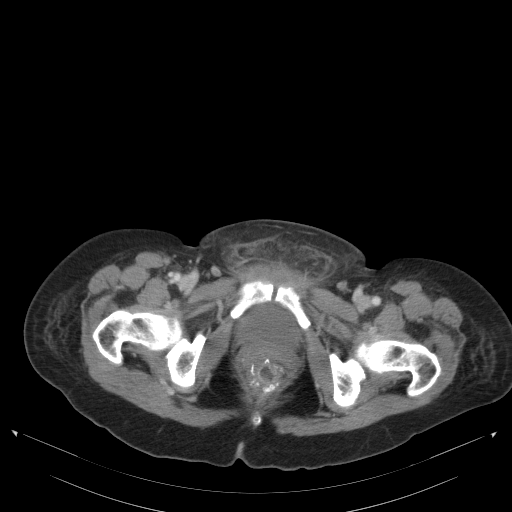
[im 17/89  soft-tissue]
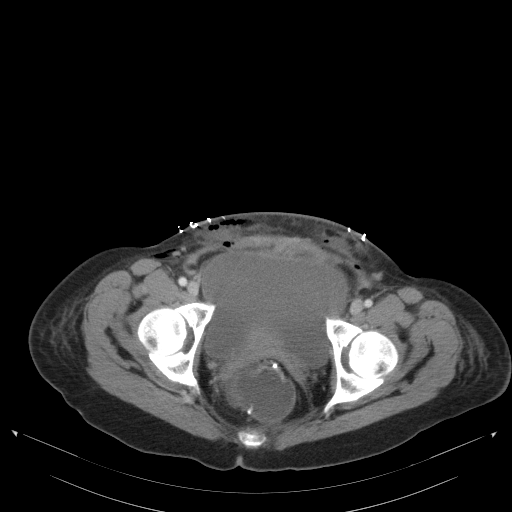
[im 28/89  soft-tissue]
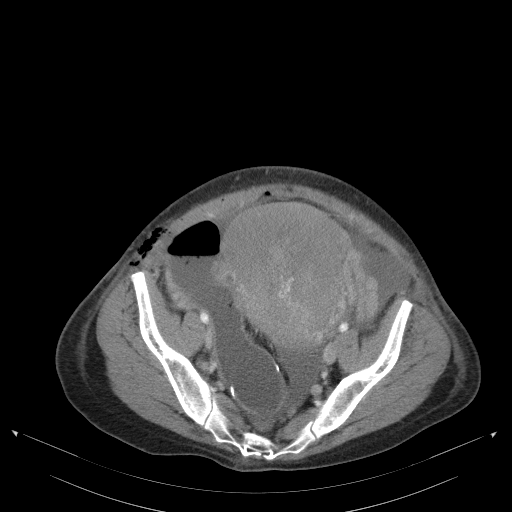
[im 34/89  soft-tissue]
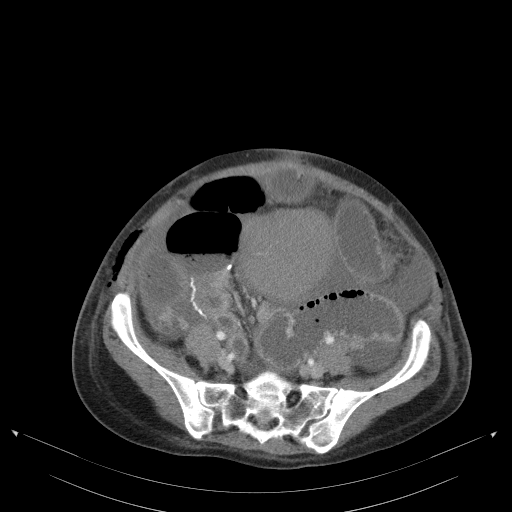
[im 39/89  soft-tissue]
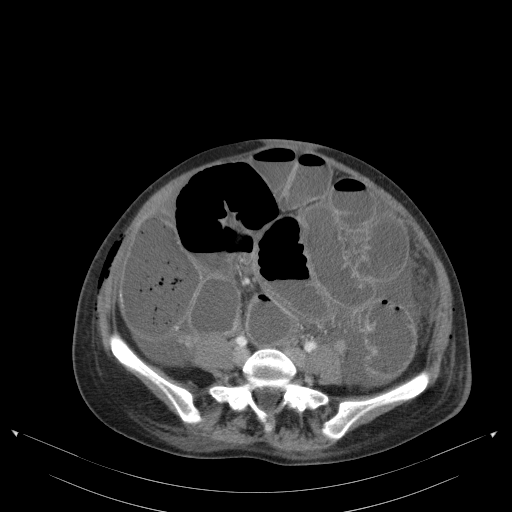
[im 45/89  soft-tissue]
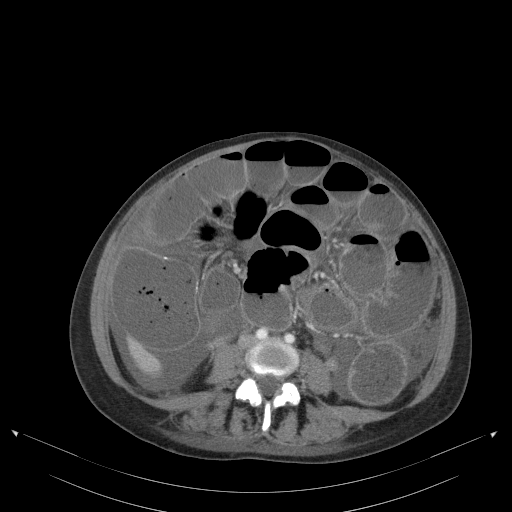
[im 50/89  soft-tissue]
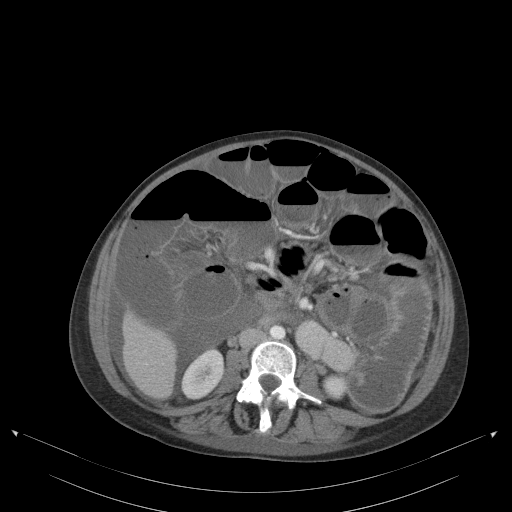
[im 56/89  soft-tissue]
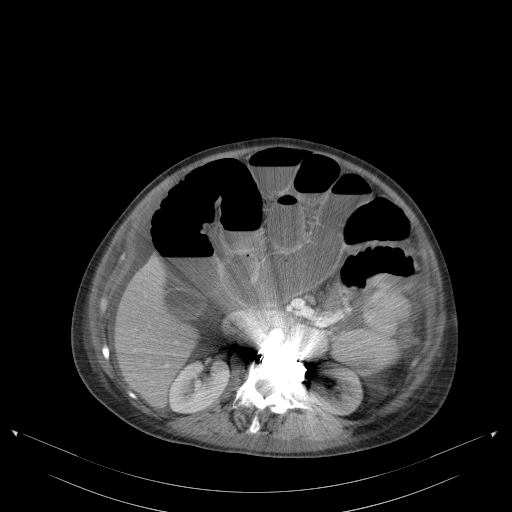
[im 56/89  bone]
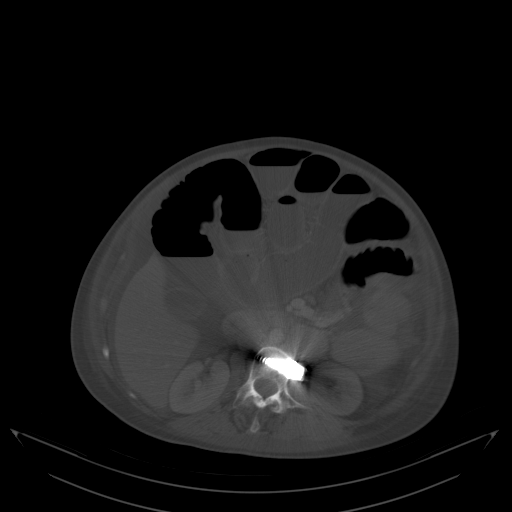
[im 61/89  soft-tissue]
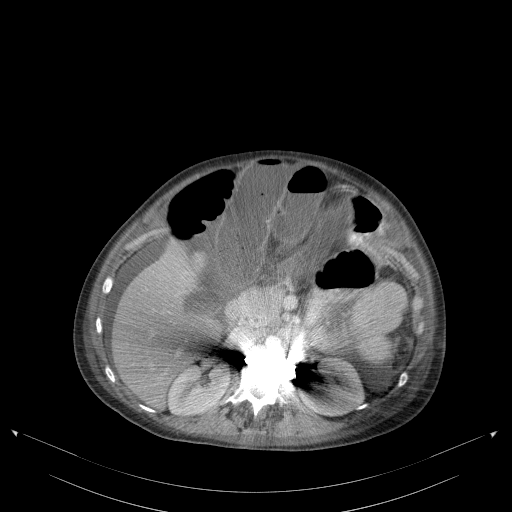
[im 67/89  lung]
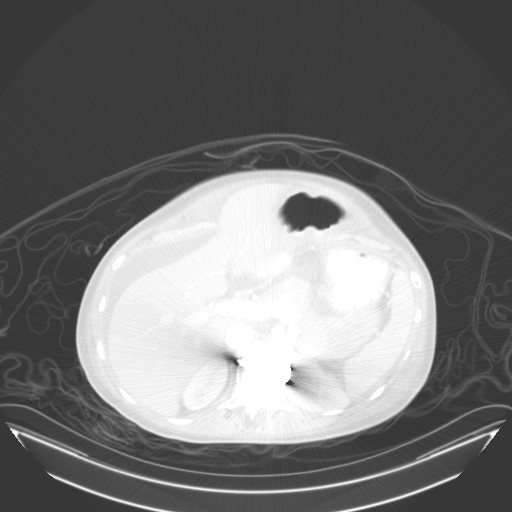
[im 72/89  soft-tissue]
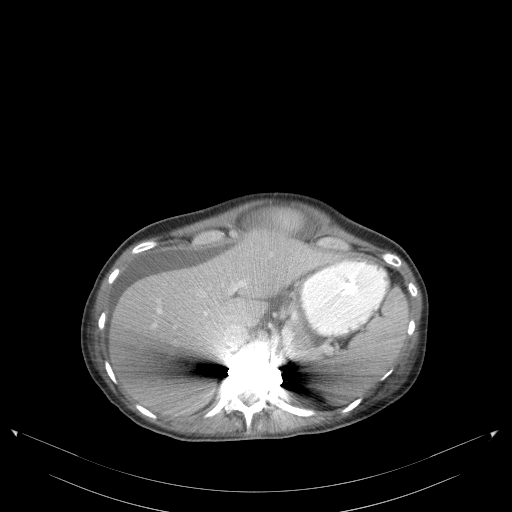
[im 72/89  lung]
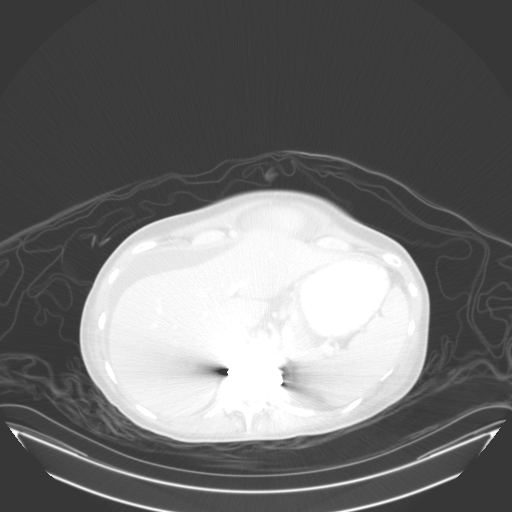
[im 78/89  soft-tissue]
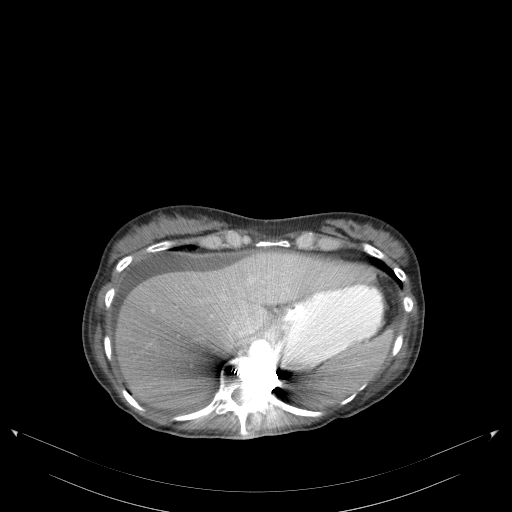
[im 78/89  lung]
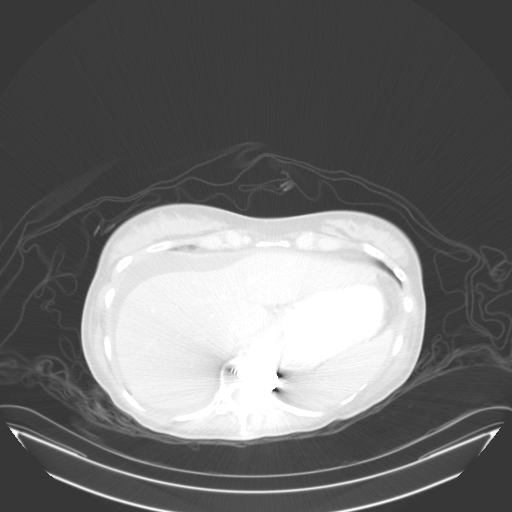
[im 83/89  soft-tissue]
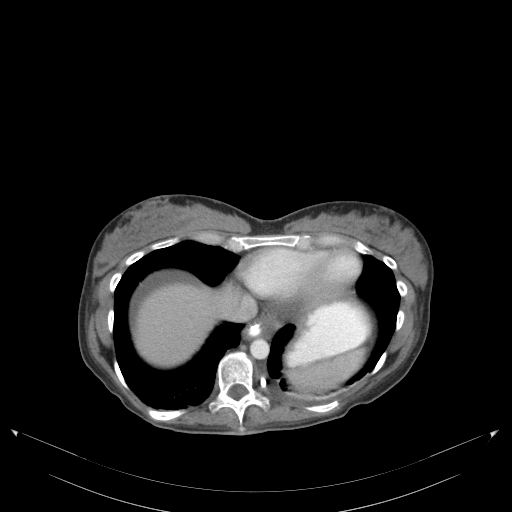
[im 83/89  lung]
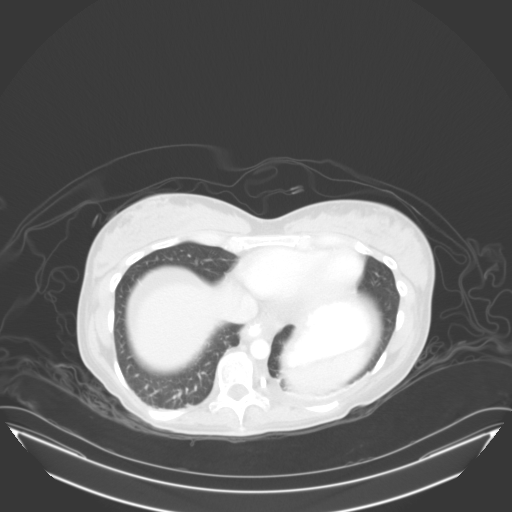

[14 of 32 positions shown; findings below may reference images not displayed]

FINDINGS: There is diffuse dilatation of small bowel seen to the
level of the rectal pouch, with air-fluid levels throughout.  This
is consistent with an ileus.  There is no evidence of a transition
point or bowel wall thickening.

Recent postsurgical changes are seen from cesarean section with
minimal residual pneumoperitoneum.  Enlarged postpartum uterus
again seen.  Mild ascites is seen, however there is no evidence of
abscess.  No focal inflammatory process identified.

Gallbladder sludge is noted, however there is no evidence of acute
cholecystitis.  The liver, spleen, pancreas, and kidneys are normal
in appearance.  No evidence of hydronephrosis.  No soft tissue
masses are identified. Lumbar spine fusion hardware noted.
IMPRESSION: 1.  Severe ileus pattern.  No evidence of bowel obstruction.
Postop changes from previous colectomy and J pouch.
2.  Mild ascites.  No evidence of focal inflammatory process or
abscess.
3.  Postpartum uterus and expected postoperative changes from
recent C-section.

## 2013-12-17 ENCOUNTER — Emergency Department (INDEPENDENT_AMBULATORY_CARE_PROVIDER_SITE_OTHER)
Admission: EM | Admit: 2013-12-17 | Discharge: 2013-12-17 | Disposition: A | Discharge status: Home / Self Care | Payer: BC Managed Care – PPO | Source: Home / Self Care | Attending: Family Medicine | Admitting: Family Medicine

## 2013-12-17 ENCOUNTER — Encounter (HOSPITAL_COMMUNITY): Payer: Self-pay | Admitting: Emergency Medicine

## 2013-12-17 DIAGNOSIS — L237 Allergic contact dermatitis due to plants, except food: Secondary | ICD-10-CM

## 2013-12-17 DIAGNOSIS — L255 Unspecified contact dermatitis due to plants, except food: Secondary | ICD-10-CM

## 2013-12-17 MED ORDER — TRIAMCINOLONE ACETONIDE 40 MG/ML IJ SUSP
INTRAMUSCULAR | Status: AC
  Filled 2013-12-17: qty 1

## 2013-12-17 MED ORDER — FLUTICASONE PROPIONATE 0.05 % EX CREA
TOPICAL_CREAM | Freq: Two times a day (BID) | CUTANEOUS | Status: AC
Start: 2013-12-17 — End: ?

## 2013-12-17 MED ORDER — METHYLPREDNISOLONE ACETATE 40 MG/ML IJ SUSP
80.0000 mg | Freq: Once | INTRAMUSCULAR | Status: AC
  Administered 2013-12-17: 80 mg via INTRAMUSCULAR

## 2013-12-17 MED ORDER — TRIAMCINOLONE ACETONIDE 40 MG/ML IJ SUSP
40.0000 mg | Freq: Once | INTRAMUSCULAR | Status: AC
  Administered 2013-12-17: 40 mg via INTRAMUSCULAR

## 2013-12-17 MED ORDER — METHYLPREDNISOLONE ACETATE 80 MG/ML IJ SUSP
INTRAMUSCULAR | Status: AC
  Filled 2013-12-17: qty 1

## 2013-12-17 NOTE — ED Notes (Signed)
Pt    Reports  Symptoms  Of a  Rash     X  4  Days      Typical of poison    Ivy     -  Pt  Displays  No  Angioedema   And  Is  In no     Acute  Distress

## 2013-12-17 NOTE — ED Provider Notes (Signed)
CSN: [id number]     Arrival date & time 12/17/13  0807 History   First MD Initiated Contact with Patient 12/17/13 [phone]     Chief Complaint  Patient presents with  . Rash   (Consider location/radiation/quality/duration/timing/severity/associated sxs/prior Treatment) Patient is a 33 y.o. female presenting with rash. The history is provided by the patient.  Rash Location:  Head/neck and leg Head/neck rash location:  R neck Leg rash location:  R upper leg Severity:  Mild Onset quality:  Gradual Duration:  4 days Progression:  Spreading Chronicity:  New Context: plant contact     Past Medical History  Diagnosis Date  . Colitis   . Postoperative ileus 11/18/2011  . Scoliosis    Past Surgical History  Procedure Laterality Date  . Scoliosis    . Colon removed    . Wisdom tooth extraction    . Cesarean section  11/15/2011    Procedure: CESAREAN SECTION;  Surgeon: Lenoard Aden, MD;  Location: [facility];  Service: Gynecology;  Laterality: N/A;  primary cesarean section of baby boy at 0058   APGAR 9/9   History reviewed. No pertinent family history. History  Substance Use Topics  . Smoking status: Never Smoker   . Smokeless tobacco: Never Used     Comment: n/a  . Alcohol Use: Yes     Comment: occasional   OB History   Grav Para Term Preterm Abortions TAB SAB Ect Mult Living   2 1  1      1      Review of Systems  Constitutional: Negative.   Skin: Positive for rash.    Allergies  Penicillins  Home Medications   Prior to Admission medications   Medication Sig Start Date End Date Taking? Authorizing Provider  ciprofloxacin (CIPRO) 250 MG tablet Take 250 mg by mouth 2 (two) times daily.    Historical Provider, MD  fluconazole (DIFLUCAN) 150 MG tablet Take 150 mg by mouth once a week.    Historical Provider, MD  metroNIDAZOLE (FLAGYL) 500 MG tablet Take 500 mg by mouth daily.    Historical Provider, MD  Polyethyl Glycol-Propyl Glycol (SYSTANE OP) Place 1 drop into both  eyes daily as needed (for dry eyes).     Historical Provider, MD  PRESCRIPTION MEDICATION Take 1 tablet by mouth daily. Junel (birth control)    Historical Provider, MD  sulfaSALAzine (AZULFIDINE) 500 MG tablet Take 1,500 mg by mouth 2 (two) times daily.    Historical Provider, MD   BP 129/83  Pulse 78  Temp(Src) 98.5 F (36.9 C) (Oral)  Resp 14  SpO2 100%  LMP 11/17/2013 Physical Exam  Nursing note and vitals reviewed. Constitutional: She is oriented to person, place, and time. She appears well-developed and well-nourished.  Neurological: She is alert and oriented to person, place, and time.  Skin: Skin is warm and dry. Rash noted.  Patchy papulovesicular streaky rash as noted.    ED Course  Procedures (including critical care time) Labs Review Labs Reviewed - No data to display  Imaging Review No results found.   MDM   1. Contact dermatitis due to poison ivy        Linna Hoff, MD 12/17/13 [phone]

## 2014-05-27 ENCOUNTER — Encounter (HOSPITAL_COMMUNITY): Payer: Self-pay | Admitting: Emergency Medicine

## 2014-07-08 ENCOUNTER — Other Ambulatory Visit: Payer: Self-pay | Admitting: Dermatology

## 2014-07-25 ENCOUNTER — Other Ambulatory Visit: Payer: Self-pay | Admitting: Dermatology

## 2014-10-10 ENCOUNTER — Encounter (HOSPITAL_COMMUNITY): Payer: Self-pay | Admitting: *Deleted

## 2014-10-10 ENCOUNTER — Other Ambulatory Visit: Payer: Self-pay | Admitting: Obstetrics and Gynecology

## 2014-10-10 NOTE — H&P (Signed)
NAME:  Gwinda MaineBARHAM, Kishana            ACCOUNT NO.:  [id number]  MEDICAL RECORD NO.:  [id number]  LOCATION:  PERIO                         FACILITY:  [facility]  PHYSICIAN:  Lenoard Adenichard J. Srinika Delone, M.D.DATE OF BIRTH:  [birth year]  DATE OF ADMISSION:  10/09/2014 DATE OF DISCHARGE:                             HISTORY & PHYSICAL   CHIEF COMPLAINT:  Recurrent left Bartholin's cyst for marsupialization.  HISTORY OF PRESENT ILLNESS:  A 34 year old white female, G1, P1 with recurrent left Bartholin's duct abscess, status post failed I and D for marsupialization.  ALLERGIES:  To PENICILLIN.  MEDICATIONS:  CIMZIA and Lomotil.  SOCIAL HISTORY:  She is a nonsmoker and nondrinker.  She denies domestic or physical violence.  She has a personal history of Crohn's colitis. She has a social history that is noncontributory.  She has a surgical history for cesarean delivery, colonic surgery with J- pouch in 2007, scoliosis with rod placement in 1999.  PHYSICAL EXAMINATION:  GENERAL:  She is a well-developed, well- nourished, white female, in no acute distress. HEENT:  Normal. NECK:  Supple.  Full range of motion. LUNGS:  Clear. HEART:  Regular rate and rhythm. ABDOMEN:  Soft, nontender. PELVIC:  Reveals on the left vulva, a 3-4 cm left Bartholin's cyst, which was previously incised and drained with a well-healed incisions noted.  IMPRESSION:  Left Bartholin's duct cyst abscess, status post failed incision and drainage with recurrent pain and discomfort, nonresponsive to antibiotics.  PLAN:  Proceed with marsupialization of left Bartholin's duct abscess. Risks of anesthesia, infection, bleeding, injury to surrounding organs, possible need for repair was discussed, delayed versus immediate complications to include bowel and bladder injury noted.  The patient acknowledges and wishes to proceed.     Lenoard Adenichard J. Anshika Pethtel, M.D.     RJT/MEDQ  D:  10/10/2014  T:  10/10/2014  Job:  [id number]

## 2014-10-11 ENCOUNTER — Ambulatory Visit (HOSPITAL_COMMUNITY): Payer: BLUE CROSS/BLUE SHIELD | Admitting: Anesthesiology

## 2014-10-11 ENCOUNTER — Ambulatory Visit (HOSPITAL_COMMUNITY)
Admission: RE | Admit: 2014-10-11 | Discharge: 2014-10-11 | Disposition: A | Discharge status: Home / Self Care | Payer: BLUE CROSS/BLUE SHIELD | Source: Ambulatory Visit | Attending: Obstetrics and Gynecology | Admitting: Obstetrics and Gynecology

## 2014-10-11 ENCOUNTER — Encounter (HOSPITAL_COMMUNITY): Payer: Self-pay | Admitting: Certified Registered Nurse Anesthetist

## 2014-10-11 ENCOUNTER — Encounter (HOSPITAL_COMMUNITY)
Admission: RE | Disposition: A | Discharge status: Home / Self Care | Payer: Self-pay | Source: Ambulatory Visit | Attending: Obstetrics and Gynecology

## 2014-10-11 DIAGNOSIS — M1990 Unspecified osteoarthritis, unspecified site: Secondary | ICD-10-CM | POA: Insufficient documentation

## 2014-10-11 DIAGNOSIS — N751 Abscess of Bartholin's gland: Secondary | ICD-10-CM | POA: Insufficient documentation

## 2014-10-11 HISTORY — DX: Unspecified osteoarthritis, unspecified site: M19.90

## 2014-10-11 HISTORY — PX: BARTHOLIN CYST MARSUPIALIZATION: SHX5383

## 2014-10-11 LAB — CBC
HCT: 41.8 % (ref 36.0–46.0)
Hemoglobin: 14.3 g/dL (ref 12.0–15.0)
MCH: 30.7 pg (ref 26.0–34.0)
MCHC: 34.2 g/dL (ref 30.0–36.0)
MCV: 89.7 fL (ref 78.0–100.0)
PLATELETS: 241 10*3/uL (ref 150–400)
RBC: 4.66 MIL/uL (ref 3.87–5.11)
RDW: 12.3 % (ref 11.5–15.5)
WBC: 9.7 10*3/uL (ref 4.0–10.5)

## 2014-10-11 LAB — HCG, SERUM, QUALITATIVE: Preg, Serum: NEGATIVE

## 2014-10-11 SURGERY — MARSUPIALIZATION, CYST, BARTHOLIN'S GLAND
Anesthesia: General | Site: Perineum | Laterality: Left

## 2014-10-11 MED ORDER — MIDAZOLAM HCL 2 MG/2ML IJ SOLN
INTRAMUSCULAR | Status: DC | PRN
  Administered 2014-10-11: 2 mg via INTRAVENOUS

## 2014-10-11 MED ORDER — ACETAMINOPHEN 160 MG/5ML PO SOLN
975.0000 mg | Freq: Four times a day (QID) | ORAL | Status: DC | PRN
  Administered 2014-10-11: 975 mg via ORAL

## 2014-10-11 MED ORDER — PHENYLEPHRINE 40 MCG/ML (10ML) SYRINGE FOR IV PUSH (FOR BLOOD PRESSURE SUPPORT)
PREFILLED_SYRINGE | INTRAVENOUS | Status: AC
  Filled 2014-10-11: qty 10

## 2014-10-11 MED ORDER — DEXAMETHASONE SODIUM PHOSPHATE 4 MG/ML IJ SOLN
INTRAMUSCULAR | Status: DC | PRN
Start: 2014-10-11 — End: 2014-10-11
  Administered 2014-10-11: 4 mg via INTRAVENOUS

## 2014-10-11 MED ORDER — PROPOFOL 10 MG/ML IV BOLUS
INTRAVENOUS | Status: AC
  Filled 2014-10-11: qty 20

## 2014-10-11 MED ORDER — KETOROLAC TROMETHAMINE 30 MG/ML IJ SOLN
INTRAMUSCULAR | Status: AC
  Filled 2014-10-11: qty 1

## 2014-10-11 MED ORDER — ACETAMINOPHEN 160 MG/5ML PO SOLN
ORAL | Status: DC
Start: 2014-10-11 — End: 2014-10-11
  Filled 2014-10-11: qty 40.6

## 2014-10-11 MED ORDER — CEFAZOLIN SODIUM-DEXTROSE 2-3 GM-% IV SOLR
INTRAVENOUS | Status: AC
  Filled 2014-10-11: qty 50

## 2014-10-11 MED ORDER — CEFAZOLIN SODIUM-DEXTROSE 2-3 GM-% IV SOLR
2.0000 g | INTRAVENOUS | Status: AC
  Administered 2014-10-11: 2 g via INTRAVENOUS

## 2014-10-11 MED ORDER — ONDANSETRON HCL 4 MG/2ML IJ SOLN
INTRAMUSCULAR | Status: AC
  Filled 2014-10-11: qty 2

## 2014-10-11 MED ORDER — BUPIVACAINE HCL (PF) 0.5 % IJ SOLN
INTRAMUSCULAR | Status: DC | PRN
  Administered 2014-10-11: 30 mL

## 2014-10-11 MED ORDER — PHENYLEPHRINE HCL 10 MG/ML IJ SOLN
INTRAMUSCULAR | Status: DC | PRN
  Administered 2014-10-11 (×2): 80 ug via INTRAVENOUS

## 2014-10-11 MED ORDER — FENTANYL CITRATE 0.05 MG/ML IJ SOLN
INTRAMUSCULAR | Status: AC
  Filled 2014-10-11: qty 2

## 2014-10-11 MED ORDER — OXYCODONE-ACETAMINOPHEN 5-325 MG PO TABS: 1.0000 | ORAL_TABLET | ORAL | Status: AC | PRN

## 2014-10-11 MED ORDER — SCOPOLAMINE 1 MG/3DAYS TD PT72
1.0000 | MEDICATED_PATCH | Freq: Once | TRANSDERMAL | Status: DC
  Administered 2014-10-11: 1.5 mg via TRANSDERMAL

## 2014-10-11 MED ORDER — LACTATED RINGERS IV SOLN
INTRAVENOUS | Status: DC
  Administered 2014-10-11 (×2): via INTRAVENOUS

## 2014-10-11 MED ORDER — LIDOCAINE HCL (CARDIAC) 20 MG/ML IV SOLN
INTRAVENOUS | Status: AC
  Filled 2014-10-11: qty 5

## 2014-10-11 MED ORDER — ONDANSETRON HCL 4 MG/2ML IJ SOLN
INTRAMUSCULAR | Status: DC | PRN
  Administered 2014-10-11: 4 mg via INTRAVENOUS

## 2014-10-11 MED ORDER — FENTANYL CITRATE 0.05 MG/ML IJ SOLN
INTRAMUSCULAR | Status: DC | PRN
  Administered 2014-10-11 (×2): 50 ug via INTRAVENOUS

## 2014-10-11 MED ORDER — BUPIVACAINE LIPOSOME 1.3 % IJ SUSP
20.0000 mL | Freq: Once | INTRAMUSCULAR | Status: AC
  Administered 2014-10-11: 20 mL
  Filled 2014-10-11: qty 20

## 2014-10-11 MED ORDER — FENTANYL CITRATE 0.05 MG/ML IJ SOLN
INTRAMUSCULAR | Status: AC
  Filled 2014-10-11: qty 5

## 2014-10-11 MED ORDER — MIDAZOLAM HCL 2 MG/2ML IJ SOLN
INTRAMUSCULAR | Status: AC
  Filled 2014-10-11: qty 2

## 2014-10-11 MED ORDER — SCOPOLAMINE 1 MG/3DAYS TD PT72
MEDICATED_PATCH | TRANSDERMAL | Status: AC
  Filled 2014-10-11: qty 1

## 2014-10-11 MED ORDER — FENTANYL CITRATE 0.05 MG/ML IJ SOLN: 25.0000 ug | INTRAMUSCULAR | Status: DC | PRN

## 2014-10-11 MED ORDER — LIDOCAINE HCL (CARDIAC) 20 MG/ML IV SOLN
INTRAVENOUS | Status: DC | PRN
  Administered 2014-10-11: 60 mg via INTRAVENOUS

## 2014-10-11 MED ORDER — KETOROLAC TROMETHAMINE 30 MG/ML IJ SOLN
INTRAMUSCULAR | Status: DC | PRN
  Administered 2014-10-11: 30 mg via INTRAVENOUS

## 2014-10-11 MED ORDER — DEXAMETHASONE SODIUM PHOSPHATE 4 MG/ML IJ SOLN
INTRAMUSCULAR | Status: AC
  Filled 2014-10-11: qty 1

## 2014-10-11 MED ORDER — PROPOFOL 10 MG/ML IV BOLUS
INTRAVENOUS | Status: DC | PRN
  Administered 2014-10-11: 150 mg via INTRAVENOUS

## 2014-10-11 MED ORDER — BUPIVACAINE HCL (PF) 0.5 % IJ SOLN
INTRAMUSCULAR | Status: AC
  Filled 2014-10-11: qty 30

## 2014-10-11 SURGICAL SUPPLY — 22 items
BLADE SURG 15 STRL LF C SS BP (BLADE) ×1 IMPLANT
BLADE SURG 15 STRL SS (BLADE) ×1
CLOTH BEACON ORANGE TIMEOUT ST (SAFETY) ×2 IMPLANT
COUNTER NEEDLE 1200 MAGNETIC (NEEDLE) ×2 IMPLANT
DECANTER SPIKE VIAL GLASS SM (MISCELLANEOUS) ×2 IMPLANT
ELECT REM PT RETURN 9FT ADLT (ELECTROSURGICAL) ×2
ELECTRODE REM PT RTRN 9FT ADLT (ELECTROSURGICAL) ×1 IMPLANT
GLOVE BIO SURGEON STRL SZ 6.5 (GLOVE) ×2 IMPLANT
GLOVE BIO SURGEON STRL SZ7.5 (GLOVE) ×2 IMPLANT
GLOVE BIOGEL PI IND STRL 6.5 (GLOVE) ×2 IMPLANT
GLOVE BIOGEL PI INDICATOR 6.5 (GLOVE) ×2
GLOVE SURG SS PI 7.0 STRL IVOR (GLOVE) ×8 IMPLANT
GOWN STRL REUS W/TWL LRG LVL3 (GOWN DISPOSABLE) ×4 IMPLANT
NS IRRIG 1000ML POUR BTL (IV SOLUTION) ×2 IMPLANT
PACK VAGINAL MINOR WOMEN LF (CUSTOM PROCEDURE TRAY) ×2 IMPLANT
PAD OB MATERNITY 4.3X12.25 (PERSONAL CARE ITEMS) ×2 IMPLANT
PAD PREP 24X48 CUFFED NSTRL (MISCELLANEOUS) ×2 IMPLANT
PENCIL BUTTON HOLSTER BLD 10FT (ELECTRODE) ×2 IMPLANT
SUT VICRYL 3 0 RAPIDE (SUTURE) ×4 IMPLANT
SUT VICRYL RAPIDE 2 0 (SUTURE) ×4 IMPLANT
TOWEL OR 17X24 6PK STRL BLUE (TOWEL DISPOSABLE) ×4 IMPLANT
WATER STERILE IRR 1000ML POUR (IV SOLUTION) ×2 IMPLANT

## 2014-10-11 NOTE — Transfer of Care (Signed)
Immediate Anesthesia Transfer of Care Note  Patient: Connie Hunter  Procedure(s) Performed: Procedure(s): BARTHOLIN CYST MARSUPIALIZATION (Left)  Patient Location: PACU  Anesthesia Type:General  Level of Consciousness: awake, alert  and oriented  Airway & Oxygen Therapy: Patient Spontanous Breathing and Patient connected to nasal cannula oxygen  Post-op Assessment: Report given to RN and Post -op Vital signs reviewed and stable  Post vital signs: Reviewed and stable  Last Vitals:  Filed Vitals:   10/11/14 0718  BP: 109/73  Pulse: 79  Temp: 36.8 C  Resp: 20    Complications: No apparent anesthesia complications

## 2014-10-11 NOTE — Anesthesia Preprocedure Evaluation (Signed)
Anesthesia Evaluation  Patient identified by MRN, date of birth, ID band Patient awake    Reviewed: Allergy & Precautions, H&P , NPO status , Patient's Chart, lab work & pertinent test results, reviewed documented beta blocker date and time   History of Anesthesia Complications Negative for: history of anesthetic complications  Airway Mallampati: II  TM Distance: >3 FB Neck ROM: full    Dental no notable dental hx. 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   Endo/Other  negative endocrine ROS  Renal/GU negative Renal ROS     Musculoskeletal   Abdominal   Peds  Hematology negative hematology ROS (+)   Anesthesia Other Findings CSE before with some challenge to place  Reproductive/Obstetrics                             Anesthesia Physical  Anesthesia Plan  ASA: II and emergent  Anesthesia Plan:    Post-op Pain Management:    Induction: Intravenous  Airway Management Planned: LMA  Additional Equipment:   Intra-op Plan:   Post-operative Plan:   Informed Consent: I have reviewed the patients History and Physical, chart, labs and discussed the procedure including the risks, benefits and alternatives for the proposed anesthesia with the patient or authorized representative who has indicated his/her understanding and acceptance.   Dental Advisory Given and Dental advisory given  Plan Discussed with: CRNA and Surgeon  Anesthesia Plan Comments: (Discussed GA with LMA, possible sore throat, potential need to switch to ETT, N/V, pulmonary aspiration. Questions  answered. )        Anesthesia Quick Evaluation

## 2014-10-11 NOTE — Progress Notes (Signed)
Patient ID: Connie Hunter, female   DOB: 08-06-1980, 34 y.o.   MRN: [id number] Patient seen and examined. Consent witnessed and signed. No changes noted. Update completed.

## 2014-10-11 NOTE — Anesthesia Postprocedure Evaluation (Signed)
  Anesthesia Post-op Note  Patient: Connie Hunter  Procedure(s) Performed: Procedure(s): BARTHOLIN CYST MARSUPIALIZATION (Left) Patient is awake and responsive. Pain and nausea are reasonably well controlled. Vital signs are stable and clinically acceptable. Oxygen saturation is clinically acceptable. There are no apparent anesthetic complications at this time. Patient is ready for discharge.

## 2014-10-11 NOTE — Op Note (Signed)
10/11/2014  9:33 AM  PATIENT:  Connie Hunter  34 y.o. female  PRE-OPERATIVE DIAGNOSIS:  Bartholin's Gland Abscess-left  POST-OPERATIVE DIAGNOSIS:  Bartholin's Gland Abscess-left  PROCEDURE:  Procedure(s): BARTHOLIN CYST MARSUPIALIZATION  SURGEON:  Surgeon(s): Olivia Mackieichard Quinnlyn Hearns, MD  ASSISTANTS: none   ANESTHESIA:   local and general  ESTIMATED BLOOD LOSS: * No blood loss amount entered *   DRAINS: none   LOCAL MEDICATIONS USED:  MARCAINE/exparel  and Amount: 30 ml  SPECIMEN:  Source of Specimen:  Cyst wall  DISPOSITION OF SPECIMEN:  PATHOLOGY  COUNTS:  YES  DICTATION #: [id number]  PLAN OF CARE: dc home  PATIENT DISPOSITION:  PACU - hemodynamically stable.

## 2014-10-11 NOTE — Op Note (Signed)
NAME:  Gwinda MaineBARHAM, Aldean            ACCOUNT NO.:  [id number]  MEDICAL RECORD NO.:  [id number]  LOCATION:  [facility]                          FACILITY:  [facility]  PHYSICIAN:  Lenoard Adenichard J. Yadhira Mckneely, M.D.DATE OF BIRTH:  [birth year]  DATE OF PROCEDURE: DATE OF DISCHARGE:  10/11/2014                              OPERATIVE REPORT   PREOPERATIVE DIAGNOSIS:  Recurrent left Bartholin's gland abscess.  POSTOPERATIVE DIAGNOSIS:  Recurrent left Bartholin's gland abscess.  PROCEDURE:  Marsupialization of left Bartholin's gland abscess.  SURGEON:  Lenoard Adenichard J. Ela Moffat, M.D.  ASSISTANT:  None.  ANESTHESIA:  General and local.  ESTIMATED BLOOD LOSS:  50 mL.  COMPLICATIONS:  None.  DRAINS:  None.  COUNTS:  Correct.  DISPOSITION:  The patient to recovery in good condition.  SPECIMEN:  Cystic duct wall.  OPERATIVE NOTE:  After being apprised of the risks of anesthesia, infection, bleeding, injury to surrounding organs, possible need for repair, delayed versus immediate complications to include bowel and bladder injury, possible need for repair, the patient was brought to the operating room where she was administered a general anesthetic without complications.  Prepped and draped in usual sterile fashion.  Bladder was catheterized until empty.  Exam under anesthesia revealed an inflamed subcutaneous mass on the left vulva consistent with recurrent abscess that measured approximately 3 x 3 cm in size.  An elliptical incision was made on the inside of the left vestibular area and the cyst wall was opened.  There was a minimum amount of serous and greenish fluid that was expressed from the cyst and does not smell like fecal contents.  At this time, the cyst wall was irrigated, good hemostasis was noted.  The cyst cavity was explored using sharp and blunt dissection, revealing minimal further drainage and appeared to be mostly inflamed from previous soft tissue reaction.  At this time, marsupialization  process was carried out using a 2-0 Vicryl Rapide suture in an interrupted fashion.  The cyst wall roof has been sent to Pathology.  Irrigation was accomplished.  Good hemostasis was noted. Multiple sutures were placed.  Dilute solution of Exparel and Marcaine 30 mL total was placed into the incision at the end of the procedure. Because of the patient's history of Crohn's colitis, a rectal exam was performed subsequently and there was some evidence of scarring, but no obvious fistulous tract, which was palpable along the left aspect just inside the anal opening.  Upon pressure into the cyst area, there was no expression or obvious fistulous tract noted at this time.  The procedure was then terminated.  Toradol was given IV.  The patient was transferred to recovery in good condition.     Lenoard Adenichard J. Mayu Ronk, M.D.     RJT/MEDQ  D:  10/11/2014  T:  10/11/2014  Job:  [id number]

## 2014-10-11 NOTE — Anesthesia Procedure Notes (Signed)
Procedure Name: LMA Insertion Date/Time: 10/11/2014 9:04 AM Performed by: Elbert EwingsHYMER, Kinisha Soper S Pre-anesthesia Checklist: Patient identified, Emergency Drugs available, Suction available, Patient being monitored and Timeout performed Patient Re-evaluated:Patient Re-evaluated prior to inductionOxygen Delivery Method: Circle system utilized Preoxygenation: Pre-oxygenation with 100% oxygen Intubation Type: IV induction LMA: LMA inserted LMA Size: 4.0 Number of attempts: 1 Dental Injury: Teeth and Oropharynx as per pre-operative assessment

## 2014-10-11 NOTE — Discharge Instructions (Signed)

## 2014-10-14 ENCOUNTER — Encounter (HOSPITAL_COMMUNITY): Payer: Self-pay | Admitting: Obstetrics and Gynecology
